# Patient Record
Sex: Female | Born: 1970 | Race: Black or African American | Hispanic: No | Smoking: Never smoker
Health system: Southern US, Community
[De-identification: ages and names within clinical notes are randomized; demographics above are authoritative.]

## PROBLEM LIST (undated history)

## (undated) DIAGNOSIS — E282 Polycystic ovarian syndrome: Secondary | ICD-10-CM

## (undated) DIAGNOSIS — C50919 Malignant neoplasm of unspecified site of unspecified female breast: Secondary | ICD-10-CM

## (undated) DIAGNOSIS — R519 Headache, unspecified: Secondary | ICD-10-CM

## (undated) DIAGNOSIS — I10 Essential (primary) hypertension: Secondary | ICD-10-CM

## (undated) DIAGNOSIS — F329 Major depressive disorder, single episode, unspecified: Secondary | ICD-10-CM

## (undated) DIAGNOSIS — D649 Anemia, unspecified: Secondary | ICD-10-CM

## (undated) DIAGNOSIS — F32A Depression, unspecified: Secondary | ICD-10-CM

## (undated) DIAGNOSIS — R569 Unspecified convulsions: Secondary | ICD-10-CM

## (undated) DIAGNOSIS — R51 Headache: Secondary | ICD-10-CM

## (undated) HISTORY — DX: Malignant neoplasm of unspecified site of unspecified female breast: C50.919

## (undated) HISTORY — DX: Polycystic ovarian syndrome: E28.2

## (undated) HISTORY — PX: KNEE SURGERY: SHX244

---

## 1995-04-30 HISTORY — PX: CHOLECYSTECTOMY: SHX55

## 1997-09-05 ENCOUNTER — Other Ambulatory Visit: Admission: RE | Admit: 1997-09-05 | Discharge: 1997-09-05 | Payer: Self-pay | Admitting: Obstetrics

## 1998-10-03 ENCOUNTER — Other Ambulatory Visit: Admission: RE | Admit: 1998-10-03 | Discharge: 1998-10-03 | Payer: Self-pay | Admitting: Obstetrics

## 1999-04-05 ENCOUNTER — Ambulatory Visit (HOSPITAL_COMMUNITY): Admission: RE | Admit: 1999-04-05 | Discharge: 1999-04-05 | Payer: Self-pay | Admitting: Family Medicine

## 1999-04-05 ENCOUNTER — Encounter: Payer: Self-pay | Admitting: Family Medicine

## 1999-06-24 ENCOUNTER — Encounter: Payer: Self-pay | Admitting: Emergency Medicine

## 1999-06-24 ENCOUNTER — Encounter: Payer: Self-pay | Admitting: Orthopaedic Surgery

## 1999-06-24 ENCOUNTER — Inpatient Hospital Stay (HOSPITAL_COMMUNITY): Admission: EM | Admit: 1999-06-24 | Discharge: 1999-06-26 | Payer: Self-pay | Admitting: Emergency Medicine

## 1999-07-17 ENCOUNTER — Encounter: Payer: Self-pay | Admitting: Orthopedic Surgery

## 1999-07-17 ENCOUNTER — Encounter: Admission: RE | Admit: 1999-07-17 | Discharge: 1999-07-17 | Payer: Self-pay | Admitting: Orthopedic Surgery

## 1999-10-18 ENCOUNTER — Other Ambulatory Visit: Admission: RE | Admit: 1999-10-18 | Discharge: 1999-10-18 | Payer: Self-pay | Admitting: Family Medicine

## 1999-10-26 ENCOUNTER — Encounter: Payer: Self-pay | Admitting: Internal Medicine

## 1999-10-26 ENCOUNTER — Ambulatory Visit (HOSPITAL_COMMUNITY): Admission: RE | Admit: 1999-10-26 | Discharge: 1999-10-26 | Payer: Self-pay | Admitting: Internal Medicine

## 2001-01-02 ENCOUNTER — Other Ambulatory Visit: Admission: RE | Admit: 2001-01-02 | Discharge: 2001-01-02 | Payer: Self-pay | Admitting: Family Medicine

## 2002-05-19 ENCOUNTER — Other Ambulatory Visit: Admission: RE | Admit: 2002-05-19 | Discharge: 2002-05-19 | Payer: Self-pay | Admitting: Obstetrics & Gynecology

## 2003-05-27 ENCOUNTER — Other Ambulatory Visit: Admission: RE | Admit: 2003-05-27 | Discharge: 2003-05-27 | Payer: Self-pay | Admitting: Obstetrics & Gynecology

## 2012-03-13 ENCOUNTER — Ambulatory Visit: Payer: Self-pay | Admitting: Orthopedic Surgery

## 2012-03-17 ENCOUNTER — Ambulatory Visit: Payer: Self-pay | Admitting: Orthopedic Surgery

## 2012-08-15 ENCOUNTER — Emergency Department: Payer: Self-pay | Admitting: Emergency Medicine

## 2012-08-15 LAB — COMPREHENSIVE METABOLIC PANEL
Albumin: 3.5 g/dL (ref 3.4–5.0)
Alkaline Phosphatase: 66 U/L (ref 50–136)
Anion Gap: 7 (ref 7–16)
BUN: 16 mg/dL (ref 7–18)
Bilirubin,Total: 0.4 mg/dL (ref 0.2–1.0)
Calcium, Total: 9 mg/dL (ref 8.5–10.1)
Chloride: 104 mmol/L (ref 98–107)
Co2: 26 mmol/L (ref 21–32)
Creatinine: 0.89 mg/dL (ref 0.60–1.30)
EGFR (African American): >60
EGFR (Non-African Amer.): >60
Glucose: 92 mg/dL (ref 65–99)
Osmolality: 275 (ref 275–301)
Potassium: 3.8 mmol/L (ref 3.5–5.1)
SGOT(AST): 25 U/L (ref 15–37)
SGPT (ALT): 34 U/L (ref 12–78)
Sodium: 137 mmol/L (ref 136–145)
Total Protein: 7.5 g/dL (ref 6.4–8.2)

## 2012-08-15 LAB — CBC
HCT: 43.6 % (ref 35.0–47.0)
HGB: 14.6 g/dL (ref 12.0–16.0)
MCH: 27.4 pg (ref 26.0–34.0)
MCHC: 33.6 g/dL (ref 32.0–36.0)
MCV: 82 fL (ref 80–100)
Platelet: 295 10*3/uL (ref 150–440)
RBC: 5.33 10*6/uL — ABNORMAL HIGH (ref 3.80–5.20)
RDW: 12.7 % (ref 11.5–14.5)
WBC: 9 10*3/uL (ref 3.6–11.0)

## 2012-08-15 LAB — URINALYSIS, COMPLETE
Bacteria: NONE SEEN
Bilirubin,UR: NEGATIVE
Blood: NEGATIVE
Glucose,UR: NEGATIVE mg/dL (ref 0–75)
Ketone: NEGATIVE
Leukocyte Esterase: NEGATIVE
Nitrite: NEGATIVE
Ph: 7 (ref 4.5–8.0)
Protein: NEGATIVE
RBC,UR: NONE SEEN /HPF (ref 0–5)
Specific Gravity: 1.014 (ref 1.003–1.030)
Squamous Epithelial: 1
WBC UR: 1 /HPF (ref 0–5)

## 2012-08-15 LAB — PREGNANCY, URINE: Pregnancy Test, Urine: NEGATIVE m[IU]/mL

## 2012-08-15 LAB — LIPASE, BLOOD: Lipase: 63 U/L — ABNORMAL LOW (ref 73–393)

## 2013-12-23 ENCOUNTER — Ambulatory Visit: Payer: Self-pay | Admitting: Unknown Physician Specialty

## 2014-09-16 LAB — LIPID PANEL
CHOLESTEROL: 155 mg/dL (ref 0–200)
HDL: 36 mg/dL (ref 35–70)
HDL: 56 mg/dL (ref 35–70)
LDL Cholesterol: 96 mg/dL
Triglycerides: 117 mg/dL (ref 40–160)

## 2014-09-16 LAB — HEMOGLOBIN A1C: HEMOGLOBIN A1C: 5.7 % (ref 4.0–6.0)

## 2014-11-29 ENCOUNTER — Encounter: Payer: Self-pay | Admitting: Family Medicine

## 2014-11-29 ENCOUNTER — Ambulatory Visit (INDEPENDENT_AMBULATORY_CARE_PROVIDER_SITE_OTHER): Payer: Managed Care, Other (non HMO) | Admitting: Family Medicine

## 2014-11-29 VITALS — BP 100/64 | HR 72 | Ht 60.0 in | Wt 184.0 lb

## 2014-11-29 DIAGNOSIS — R7309 Other abnormal glucose: Secondary | ICD-10-CM

## 2014-11-29 DIAGNOSIS — G43909 Migraine, unspecified, not intractable, without status migrainosus: Secondary | ICD-10-CM | POA: Diagnosis not present

## 2014-11-29 DIAGNOSIS — E669 Obesity, unspecified: Secondary | ICD-10-CM | POA: Diagnosis not present

## 2014-11-29 DIAGNOSIS — E786 Lipoprotein deficiency: Secondary | ICD-10-CM

## 2014-11-29 DIAGNOSIS — R7303 Prediabetes: Secondary | ICD-10-CM | POA: Insufficient documentation

## 2014-11-29 DIAGNOSIS — Z01818 Encounter for other preprocedural examination: Secondary | ICD-10-CM

## 2014-11-29 DIAGNOSIS — E559 Vitamin D deficiency, unspecified: Secondary | ICD-10-CM | POA: Insufficient documentation

## 2014-11-29 DIAGNOSIS — I10 Essential (primary) hypertension: Secondary | ICD-10-CM

## 2014-11-29 DIAGNOSIS — G43C1 Periodic headache syndromes in child or adult, intractable: Secondary | ICD-10-CM | POA: Insufficient documentation

## 2014-11-29 LAB — VITAMIN D 25 HYDROXY (VIT D DEFICIENCY, FRACTURES): Vit D, 25-Hydroxy: 14.6

## 2014-11-29 MED ORDER — VITAMIN D 50 MCG (2000 UT) PO CAPS: 1.0000 | ORAL_CAPSULE | Freq: Every day | ORAL | Status: DC

## 2014-11-29 NOTE — Progress Notes (Signed)
Date:  11/29/2014   Name:  Mallory Meyers   DOB:  08-14-1970   MRN:  [id number]  PCP:  Mallory Potter, MD    Chief Complaint: Pre-op Exam   History of Present Illness:  This is a 44 y.o. female for pre-op clearance for R foot surgery. Takes Dyazide for BP and verapamil for migraines which are well controlled. Not taking vit D currently but plans to restart. No new complaints. See PE form.  Review of Systems:  Review of Systems  Patient Active Problem List   Diagnosis Date Noted  . Prediabetes 11/29/2014  . Vitamin D deficiency 11/29/2014  . Low HDL (under 40) 11/29/2014  . Obesity, Class II, BMI 35-39.9 11/29/2014  . Hypertension 11/29/2014  . Migraines 11/29/2014    Prior to Admission medications   Medication Sig Start Date End Date Taking? Authorizing Provider  Multiple Vitamins-Minerals (ONE-A-DAY WOMENS 50 PLUS PO) Take 1 tablet by mouth daily at 12 noon.   Yes Historical Provider, MD  triamterene-hydrochlorothiazide (DYAZIDE) 37.5-25 MG per capsule Take 1 capsule by mouth daily.   Yes Historical Provider, MD  verapamil (COVERA HS) 240 MG (CO) 24 hr tablet Take 240 mg by mouth at bedtime.   Yes Historical Provider, MD    No Known Allergies  Past Surgical History  Procedure Laterality Date  . Knee surgery Left   . Cholecystectomy      History  Substance Use Topics  . Smoking status: Never Smoker   . Smokeless tobacco: Not on file  . Alcohol Use: 0.0 oz/week    0 Standard drinks or equivalent per week    No family history on file.  Medication list has been reviewed and updated.  Physical Examination: BP 100/64 mmHg  Pulse 72  Ht 5' (1.524 m)  Wt 184 lb (83.462 kg)  BMI 35.94 kg/m2  LMP 10/03/2014 (Approximate)  Physical Exam  Constitutional: She is oriented to person, place, and time. She appears well-developed and well-nourished.  HENT:  Head: Normocephalic and atraumatic.  Right Ear: External ear normal.  Left Ear: External ear normal.  Mouth/Throat:  Oropharynx is clear and moist.  Eyes: Conjunctivae and EOM are normal. Pupils are equal, round, and reactive to light.  Neck: Neck supple. No thyromegaly present.  Cardiovascular: Normal rate, regular rhythm and normal heart sounds.   Pulmonary/Chest: Effort normal and breath sounds normal.  Abdominal: Soft. She exhibits no distension and no mass. There is no tenderness.  Musculoskeletal: She exhibits no edema.  Lymphadenopathy:    She has no cervical adenopathy.  Neurological: She is alert and oriented to person, place, and time. Coordination normal.  Skin: Skin is warm and dry. No rash noted.  Psychiatric: She has a normal mood and affect. Her behavior is normal.    Assessment and Plan:  1. Prediabetes  2. Vitamin D deficiency Restart supplementation  3. Low HDL (under 40)  4. Essential hypertension Well controlled, consider d/c Dyazide on verapamil next visit  5. Migraine without status migrainosus, not intractable, unspecified migraine type Well controlled on verapamil and prn Zomig  6. Obesity, Class II, BMI 35-39.9  7. Preop general physical exam See form, cleared for foot surgery   Return in about 3 months (around 03/01/2015).  Satira Anis. Burt Ek MD [facility]  11/29/2014

## 2014-12-20 ENCOUNTER — Encounter: Payer: Self-pay | Admitting: *Deleted

## 2014-12-20 ENCOUNTER — Inpatient Hospital Stay: Admission: RE | Admit: 2014-12-20 | Payer: Self-pay | Source: Ambulatory Visit

## 2014-12-20 DIAGNOSIS — R7309 Other abnormal glucose: Secondary | ICD-10-CM | POA: Diagnosis not present

## 2014-12-20 DIAGNOSIS — Z3202 Encounter for pregnancy test, result negative: Secondary | ICD-10-CM | POA: Diagnosis not present

## 2014-12-20 DIAGNOSIS — E669 Obesity, unspecified: Secondary | ICD-10-CM | POA: Diagnosis not present

## 2014-12-20 DIAGNOSIS — E559 Vitamin D deficiency, unspecified: Secondary | ICD-10-CM | POA: Diagnosis not present

## 2014-12-20 DIAGNOSIS — G43909 Migraine, unspecified, not intractable, without status migrainosus: Secondary | ICD-10-CM | POA: Diagnosis not present

## 2014-12-20 DIAGNOSIS — Z79899 Other long term (current) drug therapy: Secondary | ICD-10-CM | POA: Diagnosis not present

## 2014-12-20 DIAGNOSIS — M2041 Other hammer toe(s) (acquired), right foot: Secondary | ICD-10-CM | POA: Diagnosis not present

## 2014-12-20 DIAGNOSIS — I10 Essential (primary) hypertension: Secondary | ICD-10-CM | POA: Diagnosis not present

## 2014-12-20 DIAGNOSIS — Z6835 Body mass index (BMI) 35.0-35.9, adult: Secondary | ICD-10-CM | POA: Diagnosis not present

## 2014-12-20 NOTE — Patient Instructions (Signed)
  Your procedure is scheduled on: 12-23-14 Report to [facility] To find out your arrival time please call [phone] between 1PM - 3PM on 12-22-14 (THURSDAY)  Remember: Instructions that are not followed completely may result in serious medical risk, up to and including death, or upon the discretion of your surgeon and anesthesiologist your surgery may need to be rescheduled.    _X___ 1. Do not eat food or drink liquids after midnight. No gum chewing or hard candies.     _X___ 2. No Alcohol for 24 hours before or after surgery.   ____ 3. Bring all medications with you on the day of surgery if instructed.    _X___ 4. Notify your doctor if there is any change in your medical condition     (cold, fever, infections).     Do not wear jewelry, make-up, hairpins, clips or nail polish.  Do not wear lotions, powders, or perfumes. You may wear deodorant.  Do not shave 48 hours prior to surgery. Men may shave face and neck.  Do not bring valuables to the hospital.    [facility] is not responsible for any belongings or valuables.               Contacts, dentures or bridgework may not be worn into surgery.  Leave your suitcase in the car. After surgery it may be brought to your room.  For patients admitted to the hospital, discharge time is determined by your treatment team.   Patients discharged the day of surgery will not be allowed to drive home.   Please read over the following fact sheets that you were given:      _X___ Take these medicines the morning of surgery with A SIP OF WATER:    1. VERAPAMIL  2.   3.   4.  5.  6.  ____ Fleet Enema (as directed)   _X___ Use CHG Soap as directed  ____ Use inhalers on the day of surgery  ____ Stop metformin 2 days prior to surgery    ____ Take 1/2 of usual insulin dose the night before surgery and none on the morning of surgery.   ____ Stop Coumadin/Plavix/aspirin-N/A  ____ Stop Anti-inflammatories-NO  NSAIDS OR ASPIRIN PRODUCTS-TYLENOL OK   ____ Stop supplements until after surgery.    ____ Bring C-Pap to the hospital.

## 2014-12-22 ENCOUNTER — Encounter
Admission: RE | Admit: 2014-12-22 | Discharge: 2014-12-22 | Disposition: A | Discharge status: Home / Self Care | Payer: Managed Care, Other (non HMO) | Source: Ambulatory Visit | Attending: Anesthesiology | Admitting: Anesthesiology

## 2014-12-22 DIAGNOSIS — M2041 Other hammer toe(s) (acquired), right foot: Secondary | ICD-10-CM | POA: Diagnosis not present

## 2014-12-22 LAB — CBC
HCT: 41.9 % (ref 35.0–47.0)
Hemoglobin: 14 g/dL (ref 12.0–16.0)
MCH: 27.4 pg (ref 26.0–34.0)
MCHC: 33.3 g/dL (ref 32.0–36.0)
MCV: 82.1 fL (ref 80.0–100.0)
PLATELETS: 216 10*3/uL (ref 150–440)
RBC: 5.11 MIL/uL (ref 3.80–5.20)
RDW: 13 % (ref 11.5–14.5)
WBC: 6.8 10*3/uL (ref 3.6–11.0)

## 2014-12-22 LAB — DIFFERENTIAL
BASOS PCT: 1 %
Basophils Absolute: 0.1 10*3/uL (ref 0–0.1)
EOS ABS: 0.2 10*3/uL (ref 0–0.7)
Eosinophils Relative: 2 %
Lymphocytes Relative: 30 %
Lymphs Abs: 2 10*3/uL (ref 1.0–3.6)
MONO ABS: 0.4 10*3/uL (ref 0.2–0.9)
Monocytes Relative: 6 %
NEUTROS ABS: 4.1 10*3/uL (ref 1.4–6.5)
Neutrophils Relative %: 61 %

## 2014-12-22 LAB — POTASSIUM: POTASSIUM: 4.1 mmol/L (ref 3.5–5.1)

## 2014-12-22 NOTE — Pre-Procedure Instructions (Signed)
DR Rosey Bath REVIEWED EKG AND SAID OK TO PROCEED

## 2014-12-23 ENCOUNTER — Ambulatory Visit: Payer: Managed Care, Other (non HMO) | Admitting: Certified Registered Nurse Anesthetist

## 2014-12-23 ENCOUNTER — Encounter
Admission: RE | Disposition: A | Discharge status: Home / Self Care | Payer: Self-pay | Source: Ambulatory Visit | Attending: Podiatry

## 2014-12-23 ENCOUNTER — Ambulatory Visit
Admission: RE | Admit: 2014-12-23 | Discharge: 2014-12-23 | Disposition: A | Discharge status: Home / Self Care | Payer: Managed Care, Other (non HMO) | Source: Ambulatory Visit | Attending: Podiatry | Admitting: Podiatry

## 2014-12-23 DIAGNOSIS — Z79899 Other long term (current) drug therapy: Secondary | ICD-10-CM | POA: Insufficient documentation

## 2014-12-23 DIAGNOSIS — R7309 Other abnormal glucose: Secondary | ICD-10-CM | POA: Insufficient documentation

## 2014-12-23 DIAGNOSIS — E669 Obesity, unspecified: Secondary | ICD-10-CM | POA: Insufficient documentation

## 2014-12-23 DIAGNOSIS — G43909 Migraine, unspecified, not intractable, without status migrainosus: Secondary | ICD-10-CM | POA: Insufficient documentation

## 2014-12-23 DIAGNOSIS — M2041 Other hammer toe(s) (acquired), right foot: Secondary | ICD-10-CM | POA: Insufficient documentation

## 2014-12-23 DIAGNOSIS — Z3202 Encounter for pregnancy test, result negative: Secondary | ICD-10-CM | POA: Insufficient documentation

## 2014-12-23 DIAGNOSIS — Z6835 Body mass index (BMI) 35.0-35.9, adult: Secondary | ICD-10-CM | POA: Insufficient documentation

## 2014-12-23 DIAGNOSIS — E559 Vitamin D deficiency, unspecified: Secondary | ICD-10-CM | POA: Insufficient documentation

## 2014-12-23 DIAGNOSIS — I10 Essential (primary) hypertension: Secondary | ICD-10-CM | POA: Insufficient documentation

## 2014-12-23 HISTORY — DX: Anemia, unspecified: D64.9

## 2014-12-23 HISTORY — DX: Major depressive disorder, single episode, unspecified: F32.9

## 2014-12-23 HISTORY — DX: Headache, unspecified: R51.9

## 2014-12-23 HISTORY — DX: Essential (primary) hypertension: I10

## 2014-12-23 HISTORY — DX: Headache: R51

## 2014-12-23 HISTORY — DX: Depression, unspecified: F32.A

## 2014-12-23 HISTORY — PX: TOE ARTHROPLASTY: SHX6504

## 2014-12-23 HISTORY — DX: Unspecified convulsions: R56.9

## 2014-12-23 LAB — POCT PREGNANCY, URINE: PREG TEST UR: NEGATIVE

## 2014-12-23 SURGERY — ARTHROPLASTY, TOE
Anesthesia: Monitor Anesthesia Care | Laterality: Right

## 2014-12-23 MED ORDER — METOCLOPRAMIDE HCL 5 MG/ML IJ SOLN: 10.0000 mg | Freq: Once | INTRAMUSCULAR | Status: DC | PRN

## 2014-12-23 MED ORDER — HYDROCODONE-ACETAMINOPHEN 5-325 MG PO TABS: 1.0000 | ORAL_TABLET | ORAL | Status: DC | PRN

## 2014-12-23 MED ORDER — LIDOCAINE HCL (CARDIAC) 20 MG/ML IV SOLN
INTRAVENOUS | Status: DC | PRN
  Administered 2014-12-23: 100 mg via INTRAVENOUS

## 2014-12-23 MED ORDER — FAMOTIDINE 20 MG PO TABS
20.0000 mg | ORAL_TABLET | Freq: Once | ORAL | Status: AC
  Administered 2014-12-23: 20 mg via ORAL

## 2014-12-23 MED ORDER — SODIUM CHLORIDE 0.9 % IJ SOLN: 3.0000 mL | Freq: Two times a day (BID) | INTRAMUSCULAR | Status: DC

## 2014-12-23 MED ORDER — PROPOFOL 10 MG/ML IV BOLUS
INTRAVENOUS | Status: DC | PRN
  Administered 2014-12-23 (×2): 30 mg via INTRAVENOUS

## 2014-12-23 MED ORDER — LACTATED RINGERS IV SOLN
INTRAVENOUS | Status: DC
  Administered 2014-12-23 (×2): via INTRAVENOUS

## 2014-12-23 MED ORDER — NEOMYCIN-POLYMYXIN B GU 40-200000 IR SOLN
Status: DC | PRN
  Administered 2014-12-23: 2 mL

## 2014-12-23 MED ORDER — BUPIVACAINE HCL 0.5 % IJ SOLN
INTRAMUSCULAR | Status: DC | PRN
  Administered 2014-12-23: 7 mL

## 2014-12-23 MED ORDER — PROPOFOL INFUSION 10 MG/ML OPTIME
INTRAVENOUS | Status: DC | PRN
  Administered 2014-12-23: 100 ug/kg/min via INTRAVENOUS

## 2014-12-23 MED ORDER — BUPIVACAINE HCL (PF) 0.5 % IJ SOLN
INTRAMUSCULAR | Status: AC
  Filled 2014-12-23: qty 30

## 2014-12-23 MED ORDER — ONDANSETRON HCL 4 MG/2ML IJ SOLN
INTRAMUSCULAR | Status: DC | PRN
  Administered 2014-12-23: 4 mg via INTRAVENOUS

## 2014-12-23 MED ORDER — NEOMYCIN-POLYMYXIN B GU 40-200000 IR SOLN
Status: AC
  Filled 2014-12-23: qty 2

## 2014-12-23 MED ORDER — MIDAZOLAM HCL 2 MG/2ML IJ SOLN
INTRAMUSCULAR | Status: DC | PRN
Start: 2014-12-23 — End: 2014-12-23
  Administered 2014-12-23: 2 mg via INTRAVENOUS

## 2014-12-23 MED ORDER — SODIUM CHLORIDE 0.9 % IV SOLN: 250.0000 mL | INTRAVENOUS | Status: DC | PRN

## 2014-12-23 MED ORDER — SODIUM CHLORIDE 0.9 % IJ SOLN: 3.0000 mL | INTRAMUSCULAR | Status: DC | PRN

## 2014-12-23 MED ORDER — HYDROMORPHONE HCL 1 MG/ML IJ SOLN: 0.2500 mg | INTRAMUSCULAR | Status: DC | PRN

## 2014-12-23 SURGICAL SUPPLY — 40 items
BLADE OSC/SAGITTAL MD 5.5X18 (BLADE) ×3 IMPLANT
BLADE SURG 15 STRL LF DISP TIS (BLADE) ×2 IMPLANT
BLADE SURG 15 STRL SS (BLADE) ×6
BLADE SURG MINI STRL (BLADE) ×3 IMPLANT
BNDG CMPR 75X21 PLY HI ABS (MISCELLANEOUS) ×2
BNDG ESMARK 4X12 TAN STRL LF (GAUZE/BANDAGES/DRESSINGS) ×3 IMPLANT
CANISTER SUCT 1200ML W/VALVE (MISCELLANEOUS) ×3 IMPLANT
CLOSURE WOUND 1/4X4 (GAUZE/BANDAGES/DRESSINGS) ×1
CUFF TOURN 18 STER (MISCELLANEOUS) ×1 IMPLANT
CUFF TOURN DUAL PL 12 NO SLV (MISCELLANEOUS) ×3 IMPLANT
DRAPE FLUOR MINI C-ARM 54X84 (DRAPES) ×3 IMPLANT
DURAPREP 26ML APPLICATOR (WOUND CARE) ×3 IMPLANT
GAUZE PETRO XEROFOAM 1X8 (MISCELLANEOUS) ×3 IMPLANT
GAUZE SPONGE 4X4 12PLY STRL (GAUZE/BANDAGES/DRESSINGS) ×3 IMPLANT
GAUZE STRETCH 2X75IN STRL (MISCELLANEOUS) ×5 IMPLANT
GLOVE BIO SURGEON STRL SZ7.5 (GLOVE) ×5 IMPLANT
GLOVE INDICATOR 8.0 STRL GRN (GLOVE) ×5 IMPLANT
GOWN STRL REUS W/ TWL LRG LVL3 (GOWN DISPOSABLE) ×2 IMPLANT
GOWN STRL REUS W/TWL LRG LVL3 (GOWN DISPOSABLE) ×6
LABEL OR SOLS (LABEL) ×1 IMPLANT
NDL FILTER BLUNT 18X1 1/2 (NEEDLE) ×1 IMPLANT
NDL HYPO 25X1 1.5 SAFETY (NEEDLE) ×2 IMPLANT
NEEDLE FILTER BLUNT 18X 1/2SAF (NEEDLE) ×2
NEEDLE FILTER BLUNT 18X1 1/2 (NEEDLE) ×1 IMPLANT
NEEDLE HYPO 25X1 1.5 SAFETY (NEEDLE) ×6 IMPLANT
NS IRRIG 500ML POUR BTL (IV SOLUTION) ×3 IMPLANT
PACK EXTREMITY ARMC (MISCELLANEOUS) ×3 IMPLANT
PAD GROUND ADULT SPLIT (MISCELLANEOUS) ×3 IMPLANT
RASP SM TEAR CROSS CUT (RASP) ×2 IMPLANT
SOL PREP PVP 2OZ (MISCELLANEOUS)
SOLUTION PREP PVP 2OZ (MISCELLANEOUS) ×1 IMPLANT
STOCKINETTE STRL 6IN 960660 (GAUZE/BANDAGES/DRESSINGS) ×3 IMPLANT
STRAP SAFETY BODY (MISCELLANEOUS) ×3 IMPLANT
STRIP CLOSURE SKIN 1/4X4 (GAUZE/BANDAGES/DRESSINGS) ×2 IMPLANT
SUCTION FRAZIER TIP 10 FR DISP (SUCTIONS) ×2 IMPLANT
SUT ETHILON 5-0 FS-2 18 BLK (SUTURE) ×3 IMPLANT
SUT VIC AB 4-0 FS2 27 (SUTURE) ×3 IMPLANT
SYRINGE 10CC LL (SYRINGE) ×3 IMPLANT
WIRE Z .045 C-WIRE SPADE TIP (WIRE) ×2 IMPLANT
WIRE Z .062 C-WIRE SPADE TIP (WIRE) ×2 IMPLANT

## 2014-12-23 NOTE — Discharge Instructions (Addendum)
1. Elevate right lower extremity area.  2. Keep the bandage on the right foot clean, dry, and do not remove.  3. Sponge bathe only right lower extremity.  4. Wear surgical shoe on the right foot whenever walking or standing.  5. Take one pain pill, Norco, every 4 hours as needed for pain.

## 2014-12-23 NOTE — Op Note (Signed)
Date of operation: 12/23/2014.  Surgeon: Durward Fortes DPM.  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  Postoperative diagnosis: Same.  Procedures: Arthroplasty right fourth and fifth toes.  Anesthesia: Local Mac.  Hemostasis: Pneumatic tourniquet right ankle.  Estimated blood loss: Minimal.  Pathology: None.  Materials: None.  Complications: None apparent.  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 Conservative care has proved unsuccessful and she elects for surgical intervention.  Operative procedure: The patient was taken to the operating room and placed on the table in the supine position. Following satisfactory sedation the right foot was anesthetized with 7 cc of 0.5% bupivacaine plain around the base of the right fourth and fifth toes. A pneumatic tourniquet was applied at the level of the right ankle and the foot was prepped and draped in the usual sterile fashion. The foot was exsanguinated and the tourniquet inflated to 250 mmHg.  Attention was then directed to the dorsal aspect of the right foot where an approximate 1.5 cm linear incisions were made over the proximal interphalangeal joint of both the fourth and fifth toes. The incision was deepened down to the level of the joint where a transverse tenotomy was performed and the capsular and periosteal tissues reflected off of the head of the proximal phalanx of both of the fourth and fifth toes. Using a pneumatic saw the head of the proximal phalanx was resected in toto on both the fourth and fifth toes. Some medial contracture was still noted in the fifth toe and a partial release of the medial aspect of the flexor tendon was performed which did relieve some of the contracture on the fifth toe. Next using a Beaver blade the periosteal tissues were reflected off of the medial and lateral aspect of  the middle phalanx on the fifth toe and using a pneumatic saw the medial and lateral prominences of the middle phalanx were resected and removed in toto. The edges were rasped smooth. There was noted be good reduction of the bony prominence clinically. Intraoperative FluoroScan views revealed good reduction of the deformity with alignment of the toes. The wounds were then flushed with copious amounts of sterile saline and closed using 40 vital simple interrupted suture for tendon reapproximation followed by skin closure using 5-0 nylon simple interrupted sutures. Xeroform and sterile bandages applied. The tourniquet was released and blood flow noted to return immediately to the right foot and all digits. Ace bandage was applied for compression. Patient tolerated the procedure and anesthesia well and was transported to the PACU with vital signs stable and in good condition.

## 2014-12-23 NOTE — Interval H&P Note (Signed)
History and Physical Interval Note:  12/23/2014 9:17 AM  Mallory Meyers  has presented today for surgery, with the diagnosis of HAMMERTOE RT.4TH AND 5TH TOES  The various methods of treatment have been discussed with the patient and family. After consideration of risks, benefits and other options for treatment, the patient has consented to  Procedure(s): TOE ARTHROPLASTY (Right) as a surgical intervention .  The patient's history has been reviewed, patient examined, no change in status, stable for surgery.  I have reviewed the patient's chart and labs.  Questions were answered to the patient's satisfaction.     [name] W.

## 2014-12-23 NOTE — Transfer of Care (Signed)
Immediate Anesthesia Transfer of Care Note  Patient: Mallory Meyers  Procedure(s) Performed: Procedure(s): TOE ARTHROPLASTY (Right)  Patient Location: PACU  Anesthesia Type:MAC  Level of Consciousness: awake  Airway & Oxygen Therapy: Patient Spontanous Breathing  Post-op Assessment: Report given to RN  Post vital signs: stable  Last Vitals:  Filed Vitals:   12/23/14 0857  BP: 122/76  Pulse: 90  Temp: 36.8 C  Resp: 16    Complications: No apparent anesthesia complications

## 2014-12-23 NOTE — Anesthesia Preprocedure Evaluation (Signed)
Anesthesia Evaluation  Patient identified by MRN, date of birth, ID band Patient awake    Reviewed: Allergy & Precautions, NPO status , Patient's Chart, lab work & pertinent test results  Airway Mallampati: II  TM Distance: >3 FB Neck ROM: Full    Dental  (+) Teeth Intact Braces.:   Pulmonary    Pulmonary exam normal       Cardiovascular Exercise Tolerance: Good hypertension, Pt. on medications Normal cardiovascular exam    Neuro/Psych    GI/Hepatic negative GI ROS,   Endo/Other  negative endocrine ROS  Renal/GU      Musculoskeletal   Abdominal (+) + obese,   Peds  Hematology negative hematology ROS (+) Hb is 14.   Anesthesia Other Findings   Reproductive/Obstetrics                             Anesthesia Physical Anesthesia Plan  ASA: II  Anesthesia Plan: MAC   Post-op Pain Management:    Induction: Intravenous  Airway Management Planned: Simple Face Mask  Additional Equipment:   Intra-op Plan:   Post-operative Plan:   Informed Consent: I have reviewed the patients History and Physical, chart, labs and discussed the procedure including the risks, benefits and alternatives for the proposed anesthesia with the patient or authorized representative who has indicated his/her understanding and acceptance.     Plan Discussed with:   Anesthesia Plan Comments:         Anesthesia Quick Evaluation

## 2014-12-23 NOTE — H&P (View-Only) (Signed)
Date:  11/29/2014   Name:  Mallory Meyers   DOB:  [birth year]   MRN:  [id number]  PCP:  Adline Potter, MD    Chief Complaint: Pre-op Exam   History of Present Illness:  This is a 44 y.o. female for pre-op clearance for R foot surgery. Takes Dyazide for BP and verapamil for migraines which are well controlled. Not taking vit D currently but plans to restart. No new complaints. See PE form.  Review of Systems:  Review of Systems  Patient Active Problem List   Diagnosis Date Noted  . Prediabetes 11/29/2014  . Vitamin D deficiency 11/29/2014  . Low HDL (under 40) 11/29/2014  . Obesity, Class II, BMI 35-39.9 11/29/2014  . Hypertension 11/29/2014  . Migraines 11/29/2014    Prior to Admission medications   Medication Sig Start Date End Date Taking? Authorizing Provider  Multiple Vitamins-Minerals (ONE-A-DAY WOMENS 50 PLUS PO) Take 1 tablet by mouth daily at 12 noon.   Yes Historical Provider, MD  triamterene-hydrochlorothiazide (DYAZIDE) 37.5-25 MG per capsule Take 1 capsule by mouth daily.   Yes Historical Provider, MD  verapamil (COVERA HS) 240 MG (CO) 24 hr tablet Take 240 mg by mouth at bedtime.   Yes Historical Provider, MD    No Known Allergies  Past Surgical History  Procedure Laterality Date  . Knee surgery Left   . Cholecystectomy      History  Substance Use Topics  . Smoking status: Never Smoker   . Smokeless tobacco: Not on file  . Alcohol Use: 0.0 oz/week    0 Standard drinks or equivalent per week    No family history on file.  Medication list has been reviewed and updated.  Physical Examination: BP 100/64 mmHg  Pulse 72  Ht 5' (1.524 m)  Wt 184 lb (83.462 kg)  BMI 35.94 kg/m2  LMP 10/03/2014 (Approximate)  Physical Exam  Constitutional: She is oriented to person, place, and time. She appears well-developed and well-nourished.  HENT:  Head: Normocephalic and atraumatic.  Right Ear: External ear normal.  Left Ear: External ear normal.  Mouth/Throat:  Oropharynx is clear and moist.  Eyes: Conjunctivae and EOM are normal. Pupils are equal, round, and reactive to light.  Neck: Neck supple. No thyromegaly present.  Cardiovascular: Normal rate, regular rhythm and normal heart sounds.   Pulmonary/Chest: Effort normal and breath sounds normal.  Abdominal: Soft. She exhibits no distension and no mass. There is no tenderness.  Musculoskeletal: She exhibits no edema.  Lymphadenopathy:    She has no cervical adenopathy.  Neurological: She is alert and oriented to person, place, and time. Coordination normal.  Skin: Skin is warm and dry. No rash noted.  Psychiatric: She has a normal mood and affect. Her behavior is normal.    Assessment and Plan:  1. Prediabetes  2. Vitamin D deficiency Restart supplementation  3. Low HDL (under 40)  4. Essential hypertension Well controlled, consider d/c Dyazide on verapamil next visit  5. Migraine without status migrainosus, not intractable, unspecified migraine type Well controlled on verapamil and prn Zomig  6. Obesity, Class II, BMI 35-39.9  7. Preop general physical exam See form, cleared for foot surgery   Return in about 3 months (around 03/01/2015).  Satira Anis. Burt Ek MD [facility]  11/29/2014

## 2014-12-23 NOTE — Anesthesia Postprocedure Evaluation (Signed)
  Anesthesia Post-op Note  Patient: Mallory Meyers  Procedure(s) Performed: Procedure(s): TOE ARTHROPLASTY (Right)  Anesthesia type:MAC  Patient location: PACU  Post pain: Pain level controlled  Post assessment: Post-op Vital signs reviewed, Patient's Cardiovascular Status Stable, Respiratory Function Stable, Patent Airway and No signs of Nausea or vomiting  Post vital signs: Reviewed and stable  Last Vitals:  Filed Vitals:   12/23/14 0857  BP: 122/76  Pulse: 90  Temp: 36.8 C  Resp: 16    Level of consciousness: awake, alert  and patient cooperative  Complications: No apparent anesthesia complications

## 2015-06-16 ENCOUNTER — Ambulatory Visit (INDEPENDENT_AMBULATORY_CARE_PROVIDER_SITE_OTHER): Payer: Managed Care, Other (non HMO) | Admitting: Family Medicine

## 2015-06-16 ENCOUNTER — Ambulatory Visit: Payer: Managed Care, Other (non HMO) | Admitting: Family Medicine

## 2015-06-16 ENCOUNTER — Encounter: Payer: Self-pay | Admitting: Family Medicine

## 2015-06-16 VITALS — BP 138/86 | HR 72 | Resp 16 | Ht 60.0 in | Wt 193.6 lb

## 2015-06-16 DIAGNOSIS — R5383 Other fatigue: Secondary | ICD-10-CM | POA: Diagnosis not present

## 2015-06-16 DIAGNOSIS — R7303 Prediabetes: Secondary | ICD-10-CM | POA: Diagnosis not present

## 2015-06-16 DIAGNOSIS — I10 Essential (primary) hypertension: Secondary | ICD-10-CM | POA: Diagnosis not present

## 2015-06-16 DIAGNOSIS — E669 Obesity, unspecified: Secondary | ICD-10-CM

## 2015-06-16 DIAGNOSIS — E559 Vitamin D deficiency, unspecified: Secondary | ICD-10-CM

## 2015-06-16 DIAGNOSIS — E66812 Obesity, class 2: Secondary | ICD-10-CM

## 2015-06-16 DIAGNOSIS — R1013 Epigastric pain: Secondary | ICD-10-CM | POA: Diagnosis not present

## 2015-06-16 DIAGNOSIS — Z23 Encounter for immunization: Secondary | ICD-10-CM

## 2015-06-16 DIAGNOSIS — G43919 Migraine, unspecified, intractable, without status migrainosus: Secondary | ICD-10-CM | POA: Diagnosis not present

## 2015-06-16 MED ORDER — VERAPAMIL HCL ER 360 MG PO CP24: 360.0000 mg | ORAL_CAPSULE | Freq: Every day | ORAL | Status: DC

## 2015-06-16 MED ORDER — ZOLMITRIPTAN 5 MG PO TABS: 5.0000 mg | ORAL_TABLET | Freq: Once | ORAL | Status: DC | PRN

## 2015-06-16 NOTE — Patient Instructions (Signed)
Increase verapamil from 240 mg daily to 360 mg daily. Increase Zomig from 2.5 mg as needed to 5 mg as needed.

## 2015-06-16 NOTE — Progress Notes (Signed)
Date:  06/16/2015   Name:  Mallory Meyers   DOB:  10/20/1970   MRN:  [id number]  PCP:  Adline Potter, MD    Chief Complaint: Gastroesophageal Reflux   History of Present Illness:  This is a 45 y.o. female requesting rx for omeprazole given frequent dyspepsia. Dx'd with H. Pylori in 2014 and rx'd with PPI and antibiotics. Migraines have been poorly controlled and has been taking frequent doses of BC powders and ibuprofen as Tylenol ineffective and insurance limits Zomig quantities. Zomig helps when she takes it. Verapamil last increased about a year ago. No longer taking Maxide for HTN and has not been taking vit D supplement regularly. Weight up 9# past 6 months. Also c/o increased fatigue over past several months. Hx prediabetes.  Review of Systems:  Review of Systems  Constitutional: Negative for fever.  Respiratory: Negative for cough and shortness of breath.   Cardiovascular: Negative for chest pain and leg swelling.  Endocrine: Negative for polyuria.  Genitourinary: Negative for difficulty urinating.  Neurological: Negative for syncope and light-headedness.    Patient Active Problem List   Diagnosis Date Noted  . Prediabetes 11/29/2014  . Vitamin D deficiency 11/29/2014  . Low HDL (under 40) 11/29/2014  . Obesity, Class II, BMI 35-39.9 11/29/2014  . Hypertension 11/29/2014  . Migraines 11/29/2014    Prior to Admission medications   Medication Sig Start Date End Date Taking? Authorizing Provider  Cholecalciferol (VITAMIN D) 2000 UNITS CAPS Take 1 capsule (2,000 Units total) by mouth daily. 11/29/14  Yes Adline Potter, MD  Multiple Vitamins-Minerals (ONE-A-DAY WOMENS 50 PLUS PO) Take 1 tablet by mouth daily at 12 noon.   Yes Historical Provider, MD  ZOLMitriptan (ZOMIG) 5 MG tablet Take 1 tablet (5 mg total) by mouth once as needed. May repeat in two hours as needed 06/16/15  Yes Adline Potter, MD  verapamil (VERELAN PM) 360 MG 24 hr capsule Take 1 capsule (360 mg total) by mouth  at bedtime. 06/16/15   Adline Potter, MD    No Known Allergies  Past Surgical History  Procedure Laterality Date  . Knee surgery Left   . Cholecystectomy    . Toe arthroplasty Right 12/23/2014    Procedure: TOE ARTHROPLASTY;  Surgeon: Sharlotte Alamo, MD;  Location: [facility] ORS;  Service: Podiatry;  Laterality: Right;    Social History  Substance Use Topics  . Smoking status: Never Smoker   . Smokeless tobacco: None  . Alcohol Use: 0.0 oz/week    0 Standard drinks or equivalent per week     Comment: [place]    History reviewed. No pertinent family history.  Medication list has been reviewed and updated.  Physical Examination: BP 138/86 mmHg  Pulse 72  Resp 16  Ht 5' (1.524 m)  Wt 193 lb 9.6 oz (87.816 kg)  BMI 37.81 kg/m2  LMP 06/02/2015 (Approximate)  Physical Exam  Constitutional: She appears well-developed and well-nourished.  Cardiovascular: Normal rate, regular rhythm and normal heart sounds.   Pulmonary/Chest: Effort normal and breath sounds normal.  Musculoskeletal: She exhibits no edema.  Neurological: She is alert.  Skin: Skin is warm and dry.  Psychiatric: She has a normal mood and affect. Her behavior is normal.  Nursing note and vitals reviewed.   Assessment and Plan:  1. Other fatigue Unclear etiology, check labs - TSH - Comprehensive metabolic panel - CBC  2. Intractable migraine without status migrainosus, unspecified migraine type Poor control, increase verapamil ER to 360 mg daily and Zomig  to 5 mg prn  3. Dyspepsia Likely due to frequent ibuprofen/BC powder use, discussed desire to avoid long-term PPI use - H. pylori antibody, IgG  4. Prediabetes On NCS diet - HgB A1c  5. Vitamin D deficiency On supplement but not taking regularly - Vitamin D (25 hydroxy)  6. Obesity, Class II, BMI 35-39.9 Discussed exercise/weight loss  7. HTN Marginal control, should improve on increased verapamil  8. Need for influenza vaccination - Flu Vaccine QUAD  36+ mos PF IM (Fluarix & Fluzone Quad PF)  Return in about 4 weeks (around 07/14/2015).  Satira Anis. [facility]  06/16/2015

## 2015-06-20 LAB — COMPREHENSIVE METABOLIC PANEL
A/G RATIO: 1.3 (ref 1.1–2.5)
ALBUMIN: 3.9 g/dL (ref 3.5–5.5)
ALT: 13 IU/L (ref 0–32)
AST: 13 IU/L (ref 0–40)
Alkaline Phosphatase: 43 IU/L (ref 39–117)
BILIRUBIN TOTAL: 0.3 mg/dL (ref 0.0–1.2)
BUN / CREAT RATIO: 16 (ref 9–23)
BUN: 15 mg/dL (ref 6–24)
CALCIUM: 9.1 mg/dL (ref 8.7–10.2)
CHLORIDE: 100 mmol/L (ref 96–106)
CO2: 23 mmol/L (ref 18–29)
Creatinine, Ser: 0.94 mg/dL (ref 0.57–1.00)
GFR, EST AFRICAN AMERICAN: 85 mL/min/{1.73_m2} (ref >59)
GFR, EST NON AFRICAN AMERICAN: 74 mL/min/{1.73_m2} (ref >59)
GLOBULIN, TOTAL: 3.1 g/dL (ref 1.5–4.5)
Glucose: 87 mg/dL (ref 65–99)
POTASSIUM: 4.7 mmol/L (ref 3.5–5.2)
SODIUM: 136 mmol/L (ref 134–144)
TOTAL PROTEIN: 7 g/dL (ref 6.0–8.5)

## 2015-06-20 LAB — CBC
HEMATOCRIT: 40.4 % (ref 34.0–46.6)
Hemoglobin: 13.6 g/dL (ref 11.1–15.9)
MCH: 27.5 pg (ref 26.6–33.0)
MCHC: 33.7 g/dL (ref 31.5–35.7)
MCV: 82 fL (ref 79–97)
Platelets: 302 10*3/uL (ref 150–379)
RBC: 4.94 x10E6/uL (ref 3.77–5.28)
RDW: 13 % (ref 12.3–15.4)
WBC: 6.6 10*3/uL (ref 3.4–10.8)

## 2015-06-20 LAB — VITAMIN D 25 HYDROXY (VIT D DEFICIENCY, FRACTURES): Vit D, 25-Hydroxy: 10.1 ng/mL — ABNORMAL LOW (ref 30.0–100.0)

## 2015-06-20 LAB — HEMOGLOBIN A1C
Est. average glucose Bld gHb Est-mCnc: 123 mg/dL
HEMOGLOBIN A1C: 5.9 % — AB (ref 4.8–5.6)

## 2015-06-20 LAB — H. PYLORI ANTIBODY, IGG: H Pylori IgG: 1.3 U/mL — ABNORMAL HIGH (ref 0.0–0.8)

## 2015-06-20 LAB — TSH: TSH: 1.45 u[IU]/mL (ref 0.450–4.500)

## 2015-07-17 ENCOUNTER — Ambulatory Visit: Payer: Managed Care, Other (non HMO) | Admitting: Family Medicine

## 2015-07-17 ENCOUNTER — Ambulatory Visit (INDEPENDENT_AMBULATORY_CARE_PROVIDER_SITE_OTHER): Payer: Managed Care, Other (non HMO) | Admitting: Family Medicine

## 2015-07-17 ENCOUNTER — Encounter: Payer: Self-pay | Admitting: Family Medicine

## 2015-07-17 VITALS — BP 142/90 | HR 81 | Resp 16 | Ht 60.0 in | Wt 189.2 lb

## 2015-07-17 DIAGNOSIS — M545 Low back pain, unspecified: Secondary | ICD-10-CM

## 2015-07-17 DIAGNOSIS — G43919 Migraine, unspecified, intractable, without status migrainosus: Secondary | ICD-10-CM | POA: Diagnosis not present

## 2015-07-17 DIAGNOSIS — I10 Essential (primary) hypertension: Secondary | ICD-10-CM | POA: Diagnosis not present

## 2015-07-17 DIAGNOSIS — E669 Obesity, unspecified: Secondary | ICD-10-CM | POA: Diagnosis not present

## 2015-07-17 DIAGNOSIS — E559 Vitamin D deficiency, unspecified: Secondary | ICD-10-CM

## 2015-07-17 DIAGNOSIS — R7303 Prediabetes: Secondary | ICD-10-CM

## 2015-07-17 DIAGNOSIS — E66812 Obesity, class 2: Secondary | ICD-10-CM

## 2015-07-17 MED ORDER — METOPROLOL SUCCINATE ER 50 MG PO TB24: 50.0000 mg | ORAL_TABLET | Freq: Every day | ORAL | Status: DC

## 2015-07-17 MED ORDER — CYCLOBENZAPRINE HCL 10 MG PO TABS: 10.0000 mg | ORAL_TABLET | Freq: Every evening | ORAL | Status: DC | PRN

## 2015-07-18 NOTE — Progress Notes (Signed)
Date:  07/17/2015   Name:  Mallory Meyers   DOB:  [birth year]   MRN:  [id number]  PCP:  Adline Potter, MD    Chief Complaint: Hypertension; Headache; and Back Pain   History of Present Illness:  This is a 45 y.o. female for one month f/u. Intolerant of increased verapamil dose, had to return to previous dose and still having frequent migraines. On Topamax in past but unsure of dose. Increased dose of Zomig is more effective. Fatigue and dyspepsia somewhat improved, blood work showed stable prediabetes and low vit D, on increased supplement. Weight down 4#. C/o R lower back pain without radiation, keeping up at night, can't afford chiro. Review of Systems:  Review of Systems  Constitutional: Negative for fever.  Respiratory: Negative for cough and shortness of breath.   Cardiovascular: Negative for chest pain and leg swelling.  Endocrine: Negative for polyuria.  Genitourinary: Negative for difficulty urinating.  Neurological: Negative for syncope and light-headedness.    Patient Active Problem List   Diagnosis Date Noted  . Prediabetes 11/29/2014  . Vitamin D deficiency 11/29/2014  . Low HDL (under 40) 11/29/2014  . Obesity, Class II, BMI 35-39.9 11/29/2014  . Hypertension 11/29/2014  . Migraines 11/29/2014    Prior to Admission medications   Medication Sig Start Date End Date Taking? Authorizing Provider  Cholecalciferol (VITAMIN D) 2000 UNITS CAPS Take 1 capsule (2,000 Units total) by mouth daily. 11/29/14  Yes Adline Potter, MD  Multiple Vitamins-Minerals (ONE-A-DAY WOMENS 50 PLUS PO) Take 1 tablet by mouth daily at 12 noon.   Yes Historical Provider, MD  ZOLMitriptan (ZOMIG) 5 MG tablet Take 1 tablet (5 mg total) by mouth once as needed. May repeat in two hours as needed 06/16/15  Yes Adline Potter, MD  cyclobenzaprine (FLEXERIL) 10 MG tablet Take 1 tablet (10 mg total) by mouth at bedtime as needed for muscle spasms. 07/17/15   Adline Potter, MD  metoprolol succinate (TOPROL-XL) 50  MG 24 hr tablet Take 1 tablet (50 mg total) by mouth daily. Take with or immediately following a meal. 07/17/15   Adline Potter, MD    No Known Allergies  Past Surgical History  Procedure Laterality Date  . Knee surgery Left   . Cholecystectomy    . Toe arthroplasty Right 12/23/2014    Procedure: TOE ARTHROPLASTY;  Surgeon: Sharlotte Alamo, MD;  Location: [facility] ORS;  Service: Podiatry;  Laterality: Right;    Social History  Substance Use Topics  . Smoking status: Never Smoker   . Smokeless tobacco: None  . Alcohol Use: 0.0 oz/week    0 Standard drinks or equivalent per week     Comment: [place]    History reviewed. No pertinent family history.  Medication list has been reviewed and updated.  Physical Examination: BP 142/90 mmHg  Pulse 81  Resp 16  Ht 5' (1.524 m)  Wt 189 lb 3.2 oz (85.821 kg)  BMI 36.95 kg/m2  SpO2 99%  Physical Exam  Constitutional: She appears well-developed and well-nourished.  Cardiovascular: Normal rate, regular rhythm and normal heart sounds.   Pulmonary/Chest: Effort normal and breath sounds normal.  Musculoskeletal: She exhibits no edema.  Negative R SLR, mild R paralumbar tenderness  Neurological: She is alert.  Skin: Skin is warm and dry.  Psychiatric: She has a normal mood and affect. Her behavior is normal.  Nursing note and vitals reviewed.   Assessment and Plan:  1. Intractable migraine without status migrainosus, unspecified migraine type Change verapamil to  metoprolol XL 50 mg daily  2. Essential hypertension Marginal control, may improve on metoprolol, could consider using with verapamil  3. Prediabetes Stable, cont NCS diet, recheck a1c next visit  4. Vitamin D deficiency On increased supplementation, recheck level next visit  5. Obesity, Class II, BMI 35-39.9 Improved, cont weight loss  6. Right-sided low back pain without sciatica Trial Flexeril 10 mg qhs  Return in about 4 weeks (around 08/14/2015).  Satira Anis. [facility]  07/18/2015

## 2015-08-14 ENCOUNTER — Ambulatory Visit (INDEPENDENT_AMBULATORY_CARE_PROVIDER_SITE_OTHER): Payer: Managed Care, Other (non HMO) | Admitting: Family Medicine

## 2015-08-14 ENCOUNTER — Encounter: Payer: Self-pay | Admitting: Family Medicine

## 2015-08-14 VITALS — BP 106/79 | HR 84 | Ht 60.0 in | Wt 188.6 lb

## 2015-08-14 DIAGNOSIS — M545 Low back pain, unspecified: Secondary | ICD-10-CM

## 2015-08-14 DIAGNOSIS — R7303 Prediabetes: Secondary | ICD-10-CM

## 2015-08-14 DIAGNOSIS — E669 Obesity, unspecified: Secondary | ICD-10-CM

## 2015-08-14 DIAGNOSIS — E559 Vitamin D deficiency, unspecified: Secondary | ICD-10-CM

## 2015-08-14 DIAGNOSIS — G43919 Migraine, unspecified, intractable, without status migrainosus: Secondary | ICD-10-CM

## 2015-08-14 DIAGNOSIS — I10 Essential (primary) hypertension: Secondary | ICD-10-CM | POA: Diagnosis not present

## 2015-08-14 MED ORDER — NAPROXEN 500 MG PO TABS: 500.0000 mg | ORAL_TABLET | Freq: Two times a day (BID) | ORAL | Status: DC

## 2015-08-14 MED ORDER — METOPROLOL SUCCINATE ER 100 MG PO TB24: 100.0000 mg | ORAL_TABLET | Freq: Every day | ORAL | Status: DC

## 2015-08-15 ENCOUNTER — Telehealth: Payer: Self-pay

## 2015-08-15 LAB — HEMOGLOBIN A1C
ESTIMATED AVERAGE GLUCOSE: 117 mg/dL
HEMOGLOBIN A1C: 5.7 % — AB (ref 4.8–5.6)

## 2015-08-15 LAB — VITAMIN D 25 HYDROXY (VIT D DEFICIENCY, FRACTURES): Vit D, 25-Hydroxy: 43.3 ng/mL (ref 30.0–100.0)

## 2015-08-15 NOTE — Telephone Encounter (Signed)
-----   Message from Adline Potter, MD sent at 08/15/2015  8:13 AM EDT ----- Inform prediabetes control improved and vit D level now normal, recommend continue current supplement

## 2015-08-15 NOTE — Telephone Encounter (Signed)
Spoke with patient. Patient advised of all results and verbalized understanding. Will call back with any future questions or concerns. MAH  

## 2015-08-15 NOTE — Progress Notes (Signed)
Date:  08/14/2015   Name:  Mallory Meyers   DOB:  [birth year]   MRN:  [id number]  PCP:  Adline Potter, MD    Chief Complaint: Follow-up and Migraine   History of Present Illness:  This is a 45 y.o. female seen in one month f/u. Verapamil was changed to metoprolol XL last visit, migraines improved, no se's noted. Weight down 1# on Contrave from previous PCP, requests refill. Still c/o R-sided LBP without radiation, Flexeril helps but makes drowsy, PT/chiro helped in past but says can't afford, on feet all day at work. Taking vit D supplement.  Review of Systems:  Review of Systems  Constitutional: Negative for fever and fatigue.  Respiratory: Negative for cough and shortness of breath.   Cardiovascular: Negative for chest pain and leg swelling.  Endocrine: Negative for polyuria.  Genitourinary: Negative for difficulty urinating.  Neurological: Negative for syncope and light-headedness.    Patient Active Problem List   Diagnosis Date Noted  . Hypertension 08/14/2015  . Right-sided low back pain without sciatica 08/14/2015  . Prediabetes 11/29/2014  . Vitamin D deficiency 11/29/2014  . Low HDL (under 40) 11/29/2014  . Obesity, Class II, BMI 35-39.9 11/29/2014  . Migraines 11/29/2014    Prior to Admission medications   Medication Sig Start Date End Date Taking? Authorizing Provider  Cholecalciferol (VITAMIN D) 2000 UNITS CAPS Take 1 capsule (2,000 Units total) by mouth daily. 11/29/14  Yes Adline Potter, MD  cyclobenzaprine (FLEXERIL) 10 MG tablet Take 1 tablet (10 mg total) by mouth at bedtime as needed for muscle spasms. 07/17/15  Yes Adline Potter, MD  metoprolol succinate (TOPROL-XL) 100 MG 24 hr tablet Take 1 tablet (100 mg total) by mouth daily. Take with or immediately following a meal. 08/14/15  Yes Adline Potter, MD  Multiple Vitamins-Minerals (ONE-A-DAY WOMENS 50 PLUS PO) Take 1 tablet by mouth daily at 12 noon.   Yes Historical Provider, MD  Naltrexone-Bupropion HCl ER  (CONTRAVE) 8-90 MG TB12 Take 1 tablet by mouth 2 (two) times daily.   Yes Historical Provider, MD  NUVARING 0.12-0.015 MG/24HR vaginal ring Take 1 tablet by mouth daily. 07/28/15  Yes Historical Provider, MD  triamterene-hydrochlorothiazide (MAXZIDE-25) 37.5-25 MG tablet Take 1 tablet by mouth daily. 07/28/15  Yes Historical Provider, MD  ZOLMitriptan (ZOMIG) 5 MG tablet Take 1 tablet (5 mg total) by mouth once as needed. May repeat in two hours as needed 06/16/15  Yes Adline Potter, MD  naproxen (NAPROSYN) 500 MG tablet Take 1 tablet (500 mg total) by mouth 2 (two) times daily with a meal. 08/14/15   Adline Potter, MD    No Known Allergies  Past Surgical History  Procedure Laterality Date  . Knee surgery Left   . Cholecystectomy    . Toe arthroplasty Right 12/23/2014    Procedure: TOE ARTHROPLASTY;  Surgeon: Sharlotte Alamo, MD;  Location: [facility] ORS;  Service: Podiatry;  Laterality: Right;    Social History  Substance Use Topics  . Smoking status: Never Smoker   . Smokeless tobacco: None  . Alcohol Use: 0.0 oz/week    0 Standard drinks or equivalent per week     Comment: occasional    Family History  Problem Relation Age of Onset  . Lung disease Mother     Medication list has been reviewed and updated.  Physical Examination: BP 106/79 mmHg  Pulse 84  Ht 5' (1.524 m)  Wt 188 lb 9.6 oz (85.548 kg)  BMI 36.83 kg/m2  Physical Exam  Constitutional: She appears well-developed and well-nourished.  Cardiovascular: Normal rate, regular rhythm and normal heart sounds.   Pulmonary/Chest: Effort normal and breath sounds normal.  Musculoskeletal: She exhibits no edema.  Moderate R paralumbar tenderness  Neurological: She is alert.  Skin: Skin is warm and dry.  Psychiatric: She has a normal mood and affect. Her behavior is normal.  Nursing note and vitals reviewed.   Assessment and Plan:  1. Intractable migraine without status migrainosus, unspecified migraine type Improved on  metoprolol XL, increase to 100 mg daily  2. Essential hypertension Well controlled on Maxide/metoprolol, consider d/c Maxide next visit  3. Prediabetes A1c 5.9% in Feb - HgB A1c  4. Right-sided low back pain without sciatica Unclear etiology, trial Naprosyn x 2 weeks, consider PT referral if ineffective  5. Vitamin D deficiency On supplement - Vitamin D (25 hydroxy)  6. Obesity, Class II, BMI 35-39.9 Weight improved, explained I cannot prescribe Contrave but ok to finish current rx   Return in about 4 weeks (around 09/11/2015).  Satira Anis. [facility]  08/15/2015

## 2015-09-11 ENCOUNTER — Encounter: Payer: Self-pay | Admitting: Family Medicine

## 2015-09-11 ENCOUNTER — Ambulatory Visit (INDEPENDENT_AMBULATORY_CARE_PROVIDER_SITE_OTHER): Payer: Managed Care, Other (non HMO) | Admitting: Family Medicine

## 2015-09-11 VITALS — BP 120/80 | HR 68 | Ht 60.0 in | Wt 190.0 lb

## 2015-09-11 DIAGNOSIS — I10 Essential (primary) hypertension: Secondary | ICD-10-CM

## 2015-09-11 DIAGNOSIS — E66812 Obesity, class 2: Secondary | ICD-10-CM

## 2015-09-11 DIAGNOSIS — E559 Vitamin D deficiency, unspecified: Secondary | ICD-10-CM

## 2015-09-11 DIAGNOSIS — E669 Obesity, unspecified: Secondary | ICD-10-CM | POA: Diagnosis not present

## 2015-09-11 DIAGNOSIS — M545 Low back pain, unspecified: Secondary | ICD-10-CM

## 2015-09-11 DIAGNOSIS — G43919 Migraine, unspecified, intractable, without status migrainosus: Secondary | ICD-10-CM | POA: Diagnosis not present

## 2015-09-11 DIAGNOSIS — R7303 Prediabetes: Secondary | ICD-10-CM

## 2015-09-11 NOTE — Progress Notes (Signed)
Date:  09/11/2015   Name:  Mallory Meyers   DOB:  Sep 21, 1970   MRN:  [id number]  PCP:  Adline Potter, MD    Chief Complaint: headaches   History of Present Illness:  This is a 45 y.o. female seen for one month f/u. Migraines significantly improved with increased metoprolol dose, only one in past month. Blood work last visit showed prediabetes improved and vit D level normal. LBP also improved, now taking both Naprosyn and Flexeril prn only.   Review of Systems:  Review of Systems  Constitutional: Negative for fever and fatigue.  Respiratory: Negative for cough and shortness of breath.   Cardiovascular: Negative for chest pain and leg swelling.  Endocrine: Negative for polyuria.  Genitourinary: Negative for difficulty urinating.  Neurological: Negative for syncope and light-headedness.    Patient Active Problem List   Diagnosis Date Noted  . Hypertension 08/14/2015  . Right-sided low back pain without sciatica 08/14/2015  . Prediabetes 11/29/2014  . Vitamin D deficiency 11/29/2014  . Low HDL (under 40) 11/29/2014  . Obesity, Class II, BMI 35-39.9 11/29/2014  . Migraines 11/29/2014    Prior to Admission medications   Medication Sig Start Date End Date Taking? Authorizing Provider  Cholecalciferol (VITAMIN D) 2000 UNITS CAPS Take 1 capsule (2,000 Units total) by mouth daily. 11/29/14  Yes Adline Potter, MD  cyclobenzaprine (FLEXERIL) 10 MG tablet Take 1 tablet (10 mg total) by mouth at bedtime as needed for muscle spasms. 07/17/15  Yes Adline Potter, MD  metoprolol succinate (TOPROL-XL) 100 MG 24 hr tablet Take 1 tablet (100 mg total) by mouth daily. Take with or immediately following a meal. 08/14/15  Yes Adline Potter, MD  Multiple Vitamins-Minerals (ONE-A-DAY WOMENS 50 PLUS PO) Take 1 tablet by mouth daily at 12 noon.   Yes Historical Provider, MD  Naltrexone-Bupropion HCl ER (CONTRAVE) 8-90 MG TB12 Take 1 tablet by mouth 2 (two) times daily.   Yes Historical Provider, MD  naproxen  (NAPROSYN) 500 MG tablet Take 1 tablet (500 mg total) by mouth 2 (two) times daily with a meal. 08/14/15  Yes Adline Potter, MD  NUVARING 0.12-0.015 MG/24HR vaginal ring Take 1 tablet by mouth daily. 07/28/15  Yes Historical Provider, MD  triamterene-hydrochlorothiazide (MAXZIDE-25) 37.5-25 MG tablet Take 1 tablet by mouth daily. 07/28/15  Yes Historical Provider, MD  ZOLMitriptan (ZOMIG) 5 MG tablet Take 1 tablet (5 mg total) by mouth once as needed. May repeat in two hours as needed 06/16/15  Yes Adline Potter, MD    No Known Allergies  Past Surgical History  Procedure Laterality Date  . Knee surgery Left   . Cholecystectomy    . Toe arthroplasty Right 12/23/2014    Procedure: TOE ARTHROPLASTY;  Surgeon: Sharlotte Alamo, MD;  Location: [facility] ORS;  Service: Podiatry;  Laterality: Right;    Social History  Substance Use Topics  . Smoking status: Never Smoker   . Smokeless tobacco: None  . Alcohol Use: 0.0 oz/week    0 Standard drinks or equivalent per week     Comment: occasional    Family History  Problem Relation Age of Onset  . Lung disease Mother     Medication list has been reviewed and updated.  Physical Examination: BP 120/80 mmHg  Pulse 68  Ht 5' (1.524 m)  Wt 190 lb (86.183 kg)  BMI 37.11 kg/m2  LMP   Physical Exam  Constitutional: She appears well-developed and well-nourished.  Cardiovascular: Normal rate, regular rhythm and normal heart sounds.  Pulmonary/Chest: Effort normal and breath sounds normal.  Musculoskeletal: She exhibits no edema.  Neurological: She is alert.  Skin: Skin is warm and dry.  Psychiatric: She has a normal mood and affect. Her behavior is normal.  Nursing note and vitals reviewed.   Assessment and Plan:  1. Intractable migraine without status migrainosus, unspecified migraine type Improved on increased metoprolol dose  2. Essential hypertension Well controlled on Maxide/metoprolol  3. Prediabetes Improved last visit, cont NCS  diet  4. Vitamin D deficiency Well controlled on supplement  5. Obesity, Class II, BMI 35-39.9 Weight up 2#, encouraged weight loss  6. Right-sided low back pain without sciatica Improved, cont prn Naprosyn/Flexeril  Return in about 6 months (around 03/13/2016).  Satira Anis. [facility]  09/11/2015

## 2015-10-17 ENCOUNTER — Other Ambulatory Visit: Payer: Self-pay | Admitting: Family Medicine

## 2015-10-17 MED ORDER — METOPROLOL SUCCINATE ER 100 MG PO TB24: 100.0000 mg | ORAL_TABLET | Freq: Every day | ORAL | Status: DC

## 2015-11-20 ENCOUNTER — Other Ambulatory Visit: Payer: Self-pay

## 2015-11-20 MED ORDER — NAPROXEN 500 MG PO TABS: 500.0000 mg | ORAL_TABLET | Freq: Two times a day (BID) | ORAL | 0 refills | Status: DC

## 2015-11-27 ENCOUNTER — Other Ambulatory Visit: Payer: Self-pay

## 2015-11-27 MED ORDER — TRIAMTERENE-HCTZ 37.5-25 MG PO TABS: 1.0000 | ORAL_TABLET | Freq: Every day | ORAL | 1 refills | Status: DC

## 2015-11-29 ENCOUNTER — Other Ambulatory Visit: Payer: Self-pay | Admitting: Internal Medicine

## 2015-12-02 ENCOUNTER — Other Ambulatory Visit: Payer: Self-pay | Admitting: Internal Medicine

## 2015-12-15 ENCOUNTER — Ambulatory Visit (INDEPENDENT_AMBULATORY_CARE_PROVIDER_SITE_OTHER): Payer: Managed Care, Other (non HMO) | Admitting: Internal Medicine

## 2015-12-15 ENCOUNTER — Encounter: Payer: Self-pay | Admitting: Internal Medicine

## 2015-12-15 VITALS — BP 118/80 | HR 98 | Resp 16 | Ht 60.0 in | Wt 197.0 lb

## 2015-12-15 DIAGNOSIS — R7303 Prediabetes: Secondary | ICD-10-CM | POA: Diagnosis not present

## 2015-12-15 DIAGNOSIS — Z0000 Encounter for general adult medical examination without abnormal findings: Secondary | ICD-10-CM | POA: Diagnosis not present

## 2015-12-15 DIAGNOSIS — G43919 Migraine, unspecified, intractable, without status migrainosus: Secondary | ICD-10-CM | POA: Diagnosis not present

## 2015-12-15 DIAGNOSIS — F39 Unspecified mood [affective] disorder: Secondary | ICD-10-CM | POA: Diagnosis not present

## 2015-12-15 DIAGNOSIS — I10 Essential (primary) hypertension: Secondary | ICD-10-CM | POA: Diagnosis not present

## 2015-12-15 DIAGNOSIS — E559 Vitamin D deficiency, unspecified: Secondary | ICD-10-CM

## 2015-12-15 MED ORDER — BUPROPION HCL ER (XL) 300 MG PO TB24: 300.0000 mg | ORAL_TABLET | Freq: Every day | ORAL | 5 refills | Status: DC

## 2015-12-15 MED ORDER — NARATRIPTAN HCL 2.5 MG PO TABS: 2.5000 mg | ORAL_TABLET | ORAL | 0 refills | Status: DC | PRN

## 2015-12-15 NOTE — Patient Instructions (Signed)
DASH Eating Plan  DASH stands for "Dietary Approaches to Stop Hypertension." The DASH eating plan is a healthy eating plan that has been shown to reduce high blood pressure (hypertension). Additional health benefits may include reducing the risk of type 2 diabetes mellitus, heart disease, and stroke. The DASH eating plan may also help with weight loss.  WHAT DO I NEED TO KNOW ABOUT THE DASH EATING PLAN?  For the DASH eating plan, you will follow these general guidelines:  · Choose foods with a percent daily value for sodium of less than 5% (as listed on the food label).  · Use salt-free seasonings or herbs instead of table salt or sea salt.  · Check with your health care provider or pharmacist before using salt substitutes.  · Eat lower-sodium products, often labeled as "lower sodium" or "no salt added."  · Eat fresh foods.  · Eat more vegetables, fruits, and low-fat dairy products.  · Choose whole grains. Look for the word "whole" as the first word in the ingredient list.  · Choose fish and skinless chicken or turkey more often than red meat. Limit fish, poultry, and meat to 6 oz (170 g) each day.  · Limit sweets, desserts, sugars, and sugary drinks.  · Choose heart-healthy fats.  · Limit cheese to 1 oz (28 g) per day.  · Eat more home-cooked food and less restaurant, buffet, and fast food.  · Limit fried foods.  · Cook foods using methods other than frying.  · Limit canned vegetables. If you do use them, rinse them well to decrease the sodium.  · When eating at a restaurant, ask that your food be prepared with less salt, or no salt if possible.  WHAT FOODS CAN I EAT?  Seek help from a dietitian for individual calorie needs.  Grains  Whole grain or whole wheat bread. Brown rice. Whole grain or whole wheat pasta. Quinoa, bulgur, and whole grain cereals. Low-sodium cereals. Corn or whole wheat flour tortillas. Whole grain cornbread. Whole grain crackers. Low-sodium crackers.  Vegetables  Fresh or frozen vegetables  (raw, steamed, roasted, or grilled). Low-sodium or reduced-sodium tomato and vegetable juices. Low-sodium or reduced-sodium tomato sauce and paste. Low-sodium or reduced-sodium canned vegetables.   Fruits  All fresh, canned (in natural juice), or frozen fruits.  Meat and Other Protein Products  Ground beef (85% or leaner), grass-fed beef, or beef trimmed of fat. Skinless chicken or turkey. Ground chicken or turkey. Pork trimmed of fat. All fish and seafood. Eggs. Dried beans, peas, or lentils. Unsalted nuts and seeds. Unsalted canned beans.  Dairy  Low-fat dairy products, such as skim or 1% milk, 2% or reduced-fat cheeses, low-fat ricotta or cottage cheese, or plain low-fat yogurt. Low-sodium or reduced-sodium cheeses.  Fats and Oils  Tub margarines without trans fats. Light or reduced-fat mayonnaise and salad dressings (reduced sodium). Avocado. Safflower, olive, or canola oils. Natural peanut or almond butter.  Other  Unsalted popcorn and pretzels.  The items listed above may not be a complete list of recommended foods or beverages. Contact your dietitian for more options.  WHAT FOODS ARE NOT RECOMMENDED?  Grains  White bread. White pasta. White rice. Refined cornbread. Bagels and croissants. Crackers that contain trans fat.  Vegetables  Creamed or fried vegetables. Vegetables in a cheese sauce. Regular canned vegetables. Regular canned tomato sauce and paste. Regular tomato and vegetable juices.  Fruits  Dried fruits. Canned fruit in light or heavy syrup. Fruit juice.  Meat and Other Protein   Products  Fatty cuts of meat. Ribs, chicken wings, bacon, sausage, bologna, salami, chitterlings, fatback, hot dogs, bratwurst, and packaged luncheon meats. Salted nuts and seeds. Canned beans with salt.  Dairy  Whole or 2% milk, cream, half-and-half, and cream cheese. Whole-fat or sweetened yogurt. Full-fat cheeses or blue cheese. Nondairy creamers and whipped toppings. Processed cheese, cheese spreads, or cheese  curds.  Condiments  Onion and garlic salt, seasoned salt, table salt, and sea salt. Canned and packaged gravies. Worcestershire sauce. Tartar sauce. Barbecue sauce. Teriyaki sauce. Soy sauce, including reduced sodium. Steak sauce. Fish sauce. Oyster sauce. Cocktail sauce. Horseradish. Ketchup and mustard. Meat flavorings and tenderizers. Bouillon cubes. Hot sauce. Tabasco sauce. Marinades. Taco seasonings. Relishes.  Fats and Oils  Butter, stick margarine, lard, shortening, ghee, and bacon fat. Coconut, palm kernel, or palm oils. Regular salad dressings.  Other  Pickles and olives. Salted popcorn and pretzels.  The items listed above may not be a complete list of foods and beverages to avoid. Contact your dietitian for more information.  WHERE CAN I FIND MORE INFORMATION?  National Heart, Lung, and Blood Institute: www.nhlbi.nih.gov/health/health-topics/topics/dash/     This information is not intended to replace advice given to you by your health care provider. Make sure you discuss any questions you have with your health care provider.     Document Released: 04/04/2011 Document Revised: 05/06/2014 Document Reviewed: 02/17/2013  Elsevier Interactive Patient Education ©2016 Elsevier Inc.

## 2015-12-15 NOTE — Progress Notes (Signed)
Date:  12/15/2015   Name:  Mallory Meyers   DOB:  1970/09/29   MRN:  [id number]   Chief Complaint: Annual Exam (Insurance premium Phys) Recent pap and pelvic at GYN in November.  Mammogram due this fall.  No breast complaints.  Labs done at work were okay.  Trig 151, HDL 46, weight high. Hypertension  Chronicity: borderline BP - good at recent work screening. Pertinent negatives include no chest pain or shortness of breath.  Migraine   This is a recurrent problem. The problem has been gradually worsening (becoming more frequent). The pain quality is similar to prior headaches. Pertinent negatives include no abdominal pain, back pain, coughing, fever, hearing loss or tinnitus. Her past medical history is significant for hypertension.  Depression         This is a recurrent problem.  The current episode started 1 to 4 weeks ago.   The onset quality is gradual.   Associated symptoms include irritable and appetite change.  Associated symptoms include no fatigue.     The symptoms are aggravated by work stress, social issues and family issues.  Past treatments include nothing.  Pre-diabetes - has gained some weight due to the above stress - divorce, tax issues, work, [employer] research officer, government. she has previously taken the weight loss drug Contrave without side effects.  Vitamin D def - on supplementation.  Lab Results  Component Value Date   CHOL 155 09/16/2014   HDL 56 09/16/2014   HDL 36 09/16/2014   LDLCALC 96 09/16/2014   TRIG 117 09/16/2014   Lab Results  Component Value Date   CREATININE 0.94 06/16/2015   Lab Results  Component Value Date   TSH 1.450 06/16/2015   Lab Results  Component Value Date   HGBA1C 5.7 (H) 08/14/2015   Wt Readings from Last 3 Encounters:  12/15/15 197 lb (89.4 kg)  09/11/15 190 lb (86.2 kg)  08/14/15 188 lb 9.6 oz (85.5 kg)     Review of Systems  Constitutional: Positive for appetite change and unexpected weight change. Negative for fatigue and fever.  HENT:  Negative for hearing loss, tinnitus and trouble swallowing.   Eyes: Negative for visual disturbance.  Respiratory: Negative for cough, chest tightness and shortness of breath.   Cardiovascular: Negative for chest pain and leg swelling.  Gastrointestinal: Negative for abdominal pain and blood in stool.  Endocrine: Negative for polyuria.  Genitourinary: Negative for difficulty urinating.  Musculoskeletal: Negative for arthralgias, back pain and gait problem.  Skin: Negative for color change and rash.  Neurological: Negative for syncope and light-headedness.  Hematological: Negative for adenopathy. Does not bruise/bleed easily.  Psychiatric/Behavioral: Positive for depression and dysphoric mood. Negative for sleep disturbance.    Patient Active Problem List   Diagnosis Date Noted  . Hypertension 08/14/2015  . Right-sided low back pain without sciatica 08/14/2015  . Prediabetes 11/29/2014  . Vitamin D deficiency 11/29/2014  . Low HDL (under 40) 11/29/2014  . Obesity, Class II, BMI 35-39.9 11/29/2014  . Migraines 11/29/2014    Prior to Admission medications   Medication Sig Start Date End Date Taking? Authorizing Provider  Cholecalciferol (VITAMIN D) 2000 UNITS CAPS Take 1 capsule (2,000 Units total) by mouth daily. 11/29/14  Yes Adline Potter, MD  cyclobenzaprine (FLEXERIL) 10 MG tablet Take 1 tablet (10 mg total) by mouth at bedtime as needed for muscle spasms. 07/17/15  Yes Adline Potter, MD  metoprolol succinate (TOPROL-XL) 100 MG 24 hr tablet Take 1 tablet (100 mg total)  by mouth daily. Take with or immediately following a meal. 10/17/15  Yes Adline Potter, MD  Multiple Vitamins-Minerals (ONE-A-DAY WOMENS 50 PLUS PO) Take 1 tablet by mouth daily at 12 noon.   Yes Historical Provider, MD  naproxen (NAPROSYN) 500 MG tablet TAKE 1 TABLET (500 MG TOTAL) BY MOUTH 2 (TWO) TIMES DAILY WITH A MEAL. 12/02/15  Yes Glean Hess, MD  NUVARING 0.12-0.015 MG/24HR vaginal ring Take 1 tablet by mouth  daily. 07/28/15  Yes Historical Provider, MD  triamterene-hydrochlorothiazide (MAXZIDE-25) 37.5-25 MG tablet Take 1 tablet by mouth daily. 11/30/15  Yes Glean Hess, MD  ZOLMitriptan (ZOMIG) 5 MG tablet Take 1 tablet (5 mg total) by mouth once as needed. May repeat in two hours as needed 06/16/15  Yes Adline Potter, MD  Naltrexone-Bupropion HCl ER (CONTRAVE) 8-90 MG TB12 Take 1 tablet by mouth 2 (two) times daily.    Historical Provider, MD    No Known Allergies  Past Surgical History:  Procedure Laterality Date  . CHOLECYSTECTOMY    . KNEE SURGERY Left   . TOE ARTHROPLASTY Right 12/23/2014   Procedure: TOE ARTHROPLASTY;  Surgeon: Sharlotte Alamo, MD;  Location: [facility] ORS;  Service: Podiatry;  Laterality: Right;    Social History  Substance Use Topics  . Smoking status: Never Smoker  . Smokeless tobacco: Never Used  . Alcohol use 0.0 oz/week     Comment: occasional     Medication list has been reviewed and updated.   Physical Exam  Constitutional: She is oriented to person, place, and time. She appears well-developed. She is irritable. No distress.  HENT:  Head: Normocephalic and atraumatic.  Neck: Normal range of motion. Neck supple. Carotid bruit is not present. No thyromegaly present.  Cardiovascular: Normal rate, regular rhythm and normal heart sounds.   Pulmonary/Chest: Effort normal and breath sounds normal. No respiratory distress.  Musculoskeletal: She exhibits no edema or tenderness.  Lymphadenopathy:    She has no cervical adenopathy.  Neurological: She is alert and oriented to person, place, and time.  Skin: Skin is warm and dry. No rash noted.  Psychiatric: She has a normal mood and affect. Her behavior is normal. Thought content normal.  Nursing note and vitals reviewed.   BP 118/80   Pulse 98   Resp 16   Ht 5' (1.524 m)   Wt 197 lb (89.4 kg)   SpO2 100%   BMI 38.47 kg/m   Assessment and Plan: 1. Annual physical exam Pap done by GYN Mammogram due this  fall Discussed exercise and dietary changes for weight management  2. Intractable migraine without status migrainosus, unspecified migraine type Try amerge as a longer acting agent - naratriptan (AMERGE) 2.5 MG tablet; Take 1 tablet (2.5 mg total) by mouth as needed for migraine. Take one (1) tablet at onset of headache; may repeat in 4 hours if needed  Dispense: 10 tablet; Refill: 0  3. Essential hypertension controlled  4. Vitamin D deficiency supplemented  5. Prediabetes Recent glucose normal  6. Mood disorder (HCC) - buPROPion (WELLBUTRIN XL) 300 MG 24 hr tablet; Take 1 tablet (300 mg total) by mouth daily.  Dispense: 30 tablet; Refill: [place], MD [facility]  12/15/2015

## 2016-02-12 ENCOUNTER — Other Ambulatory Visit: Payer: Self-pay | Admitting: Internal Medicine

## 2016-02-12 DIAGNOSIS — F39 Unspecified mood [affective] disorder: Secondary | ICD-10-CM

## 2016-02-12 MED ORDER — BUPROPION HCL ER (XL) 300 MG PO TB24: 300.0000 mg | ORAL_TABLET | Freq: Every day | ORAL | 3 refills | Status: DC

## 2016-03-11 ENCOUNTER — Ambulatory Visit (INDEPENDENT_AMBULATORY_CARE_PROVIDER_SITE_OTHER): Payer: Managed Care, Other (non HMO) | Admitting: Internal Medicine

## 2016-03-11 ENCOUNTER — Ambulatory Visit: Payer: Managed Care, Other (non HMO) | Admitting: Internal Medicine

## 2016-03-11 ENCOUNTER — Encounter: Payer: Self-pay | Admitting: Internal Medicine

## 2016-03-11 VITALS — BP 110/76 | HR 86 | Resp 16 | Ht 60.0 in | Wt 201.0 lb

## 2016-03-11 DIAGNOSIS — F39 Unspecified mood [affective] disorder: Secondary | ICD-10-CM | POA: Insufficient documentation

## 2016-03-11 DIAGNOSIS — Z23 Encounter for immunization: Secondary | ICD-10-CM

## 2016-03-11 DIAGNOSIS — G43919 Migraine, unspecified, intractable, without status migrainosus: Secondary | ICD-10-CM | POA: Diagnosis not present

## 2016-03-11 MED ORDER — ZOLMITRIPTAN 5 MG PO TABS: 5.0000 mg | ORAL_TABLET | Freq: Once | ORAL | 12 refills | Status: DC | PRN

## 2016-03-11 MED ORDER — NARATRIPTAN HCL 2.5 MG PO TABS: 2.5000 mg | ORAL_TABLET | ORAL | 12 refills | Status: DC | PRN

## 2016-03-11 NOTE — Progress Notes (Signed)
Date:  03/11/2016   Name:  Mallory Meyers   DOB:  [birth year]   MRN:  [id number]   Chief Complaint: Migraine (Having about 2-3 migraines a week but meds help. ) Migraine   This is a chronic problem. The current episode started more than 1 year ago. The problem has been gradually improving. Pertinent negatives include no fever. She has tried triptans (last visit changed to [place]) for the symptoms. Her past medical history is significant for migraine headaches.   Mood disorder - started on Wellbutrin last visit. She feels less stressed and situations are improved.   Review of Systems  Constitutional: Negative for chills, fatigue, fever and unexpected weight change.  Eyes: Negative for visual disturbance.  Respiratory: Negative for chest tightness and shortness of breath.   Cardiovascular: Negative for chest pain and leg swelling.  Neurological: Positive for headaches. Negative for tremors and syncope.  Psychiatric/Behavioral: Positive for dysphoric mood (improved). Negative for sleep disturbance.    Patient Active Problem List   Diagnosis Date Noted  . Hypertension 08/14/2015  . Right-sided low back pain without sciatica 08/14/2015  . Prediabetes 11/29/2014  . Vitamin D deficiency 11/29/2014  . Low HDL (under 40) 11/29/2014  . Obesity, Class II, BMI 35-39.9 11/29/2014  . Migraines 11/29/2014    Prior to Admission medications   Medication Sig Start Date End Date Taking? Authorizing Provider  buPROPion (WELLBUTRIN XL) 300 MG 24 hr tablet Take 1 tablet (300 mg total) by mouth daily. 02/12/16   Glean Hess, MD  Cholecalciferol (VITAMIN D) 2000 UNITS CAPS Take 1 capsule (2,000 Units total) by mouth daily. 11/29/14   Adline Potter, MD  cyclobenzaprine (FLEXERIL) 10 MG tablet Take 1 tablet (10 mg total) by mouth at bedtime as needed for muscle spasms. 07/17/15   Adline Potter, MD  metoprolol succinate (TOPROL-XL) 100 MG 24 hr tablet Take 1 tablet (100 mg total) by mouth daily. Take  with or immediately following a meal. 10/17/15   Adline Potter, MD  Multiple Vitamins-Minerals (ONE-A-DAY WOMENS 50 PLUS PO) Take 1 tablet by mouth daily at 12 noon.    Historical Provider, MD  naproxen (NAPROSYN) 500 MG tablet TAKE 1 TABLET (500 MG TOTAL) BY MOUTH 2 (TWO) TIMES DAILY WITH A MEAL. 12/02/15   Glean Hess, MD  naratriptan (AMERGE) 2.5 MG tablet Take 1 tablet (2.5 mg total) by mouth as needed for migraine. Take one (1) tablet at onset of headache; may repeat in 4 hours if needed 12/15/15   Glean Hess, MD  NUVARING 0.12-0.015 MG/24HR vaginal ring Take 1 tablet by mouth daily. 07/28/15   Historical Provider, MD  triamterene-hydrochlorothiazide (MAXZIDE-25) 37.5-25 MG tablet Take 1 tablet by mouth daily. 11/30/15   Glean Hess, MD  ZOLMitriptan (ZOMIG) 5 MG tablet Take 1 tablet (5 mg total) by mouth once as needed. May repeat in two hours as needed 06/16/15   Adline Potter, MD    No Known Allergies  Past Surgical History:  Procedure Laterality Date  . CHOLECYSTECTOMY    . KNEE SURGERY Left   . TOE ARTHROPLASTY Right 12/23/2014   Procedure: TOE ARTHROPLASTY;  Surgeon: Sharlotte Alamo, MD;  Location: [facility] ORS;  Service: Podiatry;  Laterality: Right;    Social History  Substance Use Topics  . Smoking status: Never Smoker  . Smokeless tobacco: Never Used  . Alcohol use 0.0 oz/week     Comment: occasional     Medication list has been reviewed and updated.  Physical Exam  Constitutional: She is oriented to person, place, and time. She appears well-developed. No distress.  HENT:  Head: Normocephalic and atraumatic.  Neck: Carotid bruit is not present.  Cardiovascular: Normal rate, regular rhythm and normal heart sounds.   Pulmonary/Chest: Effort normal and breath sounds normal. No respiratory distress.  Musculoskeletal: Normal range of motion.  Neurological: She is alert and oriented to person, place, and time.  Skin: Skin is warm and dry. No rash noted.  Psychiatric:  She has a normal mood and affect. Her speech is normal and behavior is normal. Thought content normal.  Nursing note and vitals reviewed.   BP 110/76   Pulse 86   Resp 16   Ht 5' (1.524 m)   Wt 201 lb (91.2 kg)   SpO2 100%   BMI 39.26 kg/m   Assessment and Plan: 1. Encounter for immunization - Flu Vaccine QUAD 36+ mos IM  2. Intractable migraine without status migrainosus, unspecified migraine type Continue current therapy - naratriptan (AMERGE) 2.5 MG tablet; Take 1 tablet (2.5 mg total) by mouth as needed for migraine. Take one (1) tablet at onset of headache; may repeat in 4 hours if needed  Dispense: 10 tablet; Refill: 12 - zolmitriptan (ZOMIG) 5 MG tablet; Take 1 tablet (5 mg total) by mouth once as needed. May repeat in two hours as needed  Dispense: 12 tablet; Refill: 12  3. Mood disorder ([place]) Improved - continue Wellbutrin   Halina Maidens, MD [facility]  03/11/2016

## 2016-05-20 ENCOUNTER — Encounter: Payer: Self-pay | Admitting: Internal Medicine

## 2016-05-20 ENCOUNTER — Ambulatory Visit (INDEPENDENT_AMBULATORY_CARE_PROVIDER_SITE_OTHER): Payer: Managed Care, Other (non HMO) | Admitting: Internal Medicine

## 2016-05-20 VITALS — BP 116/88 | HR 82 | Temp 97.6°F | Ht 60.0 in | Wt 199.0 lb

## 2016-05-20 DIAGNOSIS — J111 Influenza due to unidentified influenza virus with other respiratory manifestations: Secondary | ICD-10-CM | POA: Diagnosis not present

## 2016-05-20 MED ORDER — OSELTAMIVIR PHOSPHATE 75 MG PO CAPS: 75.0000 mg | ORAL_CAPSULE | Freq: Two times a day (BID) | ORAL | 0 refills | Status: DC

## 2016-05-20 MED ORDER — GUAIFENESIN-CODEINE 100-10 MG/5ML PO SYRP: 5.0000 mL | ORAL_SOLUTION | Freq: Three times a day (TID) | ORAL | 0 refills | Status: DC | PRN

## 2016-05-20 NOTE — Progress Notes (Signed)
Date:  05/20/2016   Name:  Mallory Meyers   DOB:  [birth year]   MRN:  [id number]   Chief Complaint: Fever (Pt stated coughing, fever, body ache) Fever   This is a new problem. The current episode started yesterday. Her temperature was unmeasured prior to arrival. Associated symptoms include coughing and muscle aches. Pertinent negatives include no abdominal pain, chest pain, diarrhea, vomiting or wheezing. She has tried acetaminophen for the symptoms. The treatment provided moderate relief.      Review of Systems  Constitutional: Positive for chills, fatigue and fever.  Respiratory: Positive for cough. Negative for chest tightness and wheezing.   Cardiovascular: Negative for chest pain and palpitations.  Gastrointestinal: Negative for abdominal pain, diarrhea and vomiting.  Musculoskeletal: Negative for myalgias.    Patient Active Problem List   Diagnosis Date Noted  . Mood disorder ([place]) 03/11/2016  . Hypertension 08/14/2015  . Right-sided low back pain without sciatica 08/14/2015  . Prediabetes 11/29/2014  . Vitamin D deficiency 11/29/2014  . Low HDL (under 40) 11/29/2014  . Obesity, Class II, BMI 35-39.9 11/29/2014  . Migraines 11/29/2014    Prior to Admission medications   Medication Sig Start Date End Date Taking? Authorizing Provider  buPROPion (WELLBUTRIN XL) 300 MG 24 hr tablet Take 1 tablet (300 mg total) by mouth daily. 02/12/16  Yes Glean Hess, MD  Cholecalciferol (VITAMIN D) 2000 UNITS CAPS Take 1 capsule (2,000 Units total) by mouth daily. 11/29/14  Yes Adline Potter, MD  cyclobenzaprine (FLEXERIL) 10 MG tablet Take 1 tablet (10 mg total) by mouth at bedtime as needed for muscle spasms. 07/17/15  Yes Adline Potter, MD  metoprolol succinate (TOPROL-XL) 100 MG 24 hr tablet Take 1 tablet (100 mg total) by mouth daily. Take with or immediately following a meal. 10/17/15  Yes Adline Potter, MD  Multiple Vitamins-Minerals (ONE-A-DAY WOMENS 50 PLUS PO) Take 1 tablet by  mouth daily at 12 noon.   Yes Historical Provider, MD  naproxen (NAPROSYN) 500 MG tablet TAKE 1 TABLET (500 MG TOTAL) BY MOUTH 2 (TWO) TIMES DAILY WITH A MEAL. 12/02/15  Yes Glean Hess, MD  naratriptan (AMERGE) 2.5 MG tablet Take 1 tablet (2.5 mg total) by mouth as needed for migraine. Take one (1) tablet at onset of headache; may repeat in 4 hours if needed 03/11/16  Yes Glean Hess, MD  NUVARING 0.12-0.015 MG/24HR vaginal ring Take 1 tablet by mouth daily. 07/28/15  Yes Historical Provider, MD  triamterene-hydrochlorothiazide (MAXZIDE-25) 37.5-25 MG tablet Take 1 tablet by mouth daily. 11/30/15  Yes Glean Hess, MD  zolmitriptan (ZOMIG) 5 MG tablet Take 1 tablet (5 mg total) by mouth once as needed. May repeat in two hours as needed 03/11/16  Yes Glean Hess, MD    No Known Allergies  Past Surgical History:  Procedure Laterality Date  . CHOLECYSTECTOMY    . KNEE SURGERY Left   . TOE ARTHROPLASTY Right 12/23/2014   Procedure: TOE ARTHROPLASTY;  Surgeon: Sharlotte Alamo, MD;  Location: [facility] ORS;  Service: Podiatry;  Laterality: Right;    Social History  Substance Use Topics  . Smoking status: Never Smoker  . Smokeless tobacco: Never Used  . Alcohol use 0.0 oz/week     Comment: occasional     Medication list has been reviewed and updated.   Physical Exam  Constitutional: She is oriented to person, place, and time. Vital signs are normal. She appears well-developed. She appears ill. No distress.  HENT:  Head: Normocephalic and atraumatic.  Right Ear: Tympanic membrane and ear canal normal.  Left Ear: Tympanic membrane and ear canal normal.  Nose: Right sinus exhibits no maxillary sinus tenderness. Left sinus exhibits no maxillary sinus tenderness.  Mouth/Throat: Posterior oropharyngeal erythema present. No posterior oropharyngeal edema.  Cardiovascular: Normal rate, regular rhythm and normal heart sounds.   Pulmonary/Chest: Effort normal and breath sounds normal. No  respiratory distress. She has no wheezes. She has no rhonchi.  Musculoskeletal: Normal range of motion.  Lymphadenopathy:       Head (right side): No submental and no submandibular adenopathy present.       Head (left side): No submental and no submandibular adenopathy present.  Neurological: She is alert and oriented to person, place, and time.  Skin: Skin is warm and dry. No rash noted.  Psychiatric: She has a normal mood and affect. Her behavior is normal. Thought content normal.  Nursing note and vitals reviewed.   BP 116/88   Pulse 82   Temp 97.6 F (36.4 C)   Ht 5' (1.524 m)   Wt 199 lb (90.3 kg)   SpO2 99%   BMI 38.86 kg/m   Assessment and Plan: 1. Influenza Take advil or tylenol, increase fluids and rest - oseltamivir (TAMIFLU) 75 MG capsule; Take 1 capsule (75 mg total) by mouth 2 (two) times daily.  Dispense: 10 capsule; Refill: 0 - guaiFENesin-codeine (ROBITUSSIN AC) 100-10 MG/5ML syrup; Take 5 mLs by mouth 3 (three) times daily as needed for cough.  Dispense: 150 mL; Refill: 0   Halina Maidens, MD [facility]  05/20/2016

## 2016-05-31 ENCOUNTER — Other Ambulatory Visit: Payer: Self-pay | Admitting: Internal Medicine

## 2016-06-12 ENCOUNTER — Telehealth: Payer: Self-pay

## 2016-06-12 ENCOUNTER — Other Ambulatory Visit: Payer: Self-pay | Admitting: Internal Medicine

## 2016-06-12 DIAGNOSIS — J40 Bronchitis, not specified as acute or chronic: Secondary | ICD-10-CM

## 2016-06-12 MED ORDER — DOXYCYCLINE HYCLATE 100 MG PO TABS: 100.0000 mg | ORAL_TABLET | Freq: Two times a day (BID) | ORAL | 0 refills | Status: DC

## 2016-06-12 MED ORDER — BENZONATATE 200 MG PO CAPS: 200.0000 mg | ORAL_CAPSULE | Freq: Two times a day (BID) | ORAL | 0 refills | Status: DC | PRN

## 2016-06-12 NOTE — Telephone Encounter (Signed)
Called pt and let her know medication was sent to pharmacy, and Dr. Army Melia Instructions.

## 2016-06-12 NOTE — Telephone Encounter (Signed)
Pt called reporting she finished Tamiflu but still has sore throat, and painful cough. Was requesting to get a different medication sent in to treat these symptoms. Her pharmacy is [facility] in [place]. Advice please?

## 2016-06-12 NOTE — Telephone Encounter (Signed)
Sent Rx for bronchitis and cough to [facility].  If not resolved after treatment, will need another OV.

## 2016-06-14 ENCOUNTER — Ambulatory Visit (INDEPENDENT_AMBULATORY_CARE_PROVIDER_SITE_OTHER): Payer: Managed Care, Other (non HMO) | Admitting: Internal Medicine

## 2016-06-14 ENCOUNTER — Encounter: Payer: Self-pay | Admitting: Internal Medicine

## 2016-06-14 VITALS — BP 130/88 | HR 102 | Temp 98.2°F | Ht 60.0 in | Wt 213.0 lb

## 2016-06-14 DIAGNOSIS — J029 Acute pharyngitis, unspecified: Secondary | ICD-10-CM | POA: Diagnosis not present

## 2016-06-14 DIAGNOSIS — J111 Influenza due to unidentified influenza virus with other respiratory manifestations: Secondary | ICD-10-CM

## 2016-06-14 DIAGNOSIS — B309 Viral conjunctivitis, unspecified: Secondary | ICD-10-CM | POA: Diagnosis not present

## 2016-06-14 MED ORDER — NEOMYCIN-POLYMYXIN-DEXAMETH 3.5-10000-0.1 OP SUSP: 2.0000 [drp] | Freq: Four times a day (QID) | OPHTHALMIC | 0 refills | Status: DC

## 2016-06-14 MED ORDER — AMOXICILLIN 875 MG PO TABS: 875.0000 mg | ORAL_TABLET | Freq: Two times a day (BID) | ORAL | 0 refills | Status: DC

## 2016-06-14 MED ORDER — GUAIFENESIN-CODEINE 100-10 MG/5ML PO SYRP: 5.0000 mL | ORAL_SOLUTION | Freq: Three times a day (TID) | ORAL | 0 refills | Status: DC | PRN

## 2016-06-14 NOTE — Progress Notes (Signed)
Date:  06/14/2016   Name:  Mallory Meyers   DOB:  May 09, 1970   MRN:  [id number]   Chief Complaint: Sore Throat (difficulty swallowing, and breaking out in sweats. cough w/ no production.) Sore Throat   This is a new problem. The current episode started yesterday. The problem has been gradually worsening. Neither side of throat is experiencing more pain than the other. There has been no fever. The pain is severe. Associated symptoms include coughing, headaches, a hoarse voice, swollen glands and trouble swallowing. Pertinent negatives include no abdominal pain or congestion. She has tried NSAIDs for the symptoms. The treatment provided mild relief.  Conjunctivitis   The current episode started 2 days ago. The onset was gradual. The problem has been unchanged. The problem is moderate. Associated symptoms include a fever, headaches, sore throat, swollen glands, cough and eye redness. Pertinent negatives include no eye itching, no abdominal pain, no congestion, no wheezing, no eye discharge and no eye pain.  She felt better after the tamiflu and the cough had improved somewhat.  Two days ago began to feel worse and developed very sore throat, cough and headache.  She has not been taking any anti-inflammatory medication.     Review of Systems  Constitutional: Positive for fever. Negative for chills, fatigue and unexpected weight change.  HENT: Positive for hoarse voice, sore throat and trouble swallowing. Negative for congestion.   Eyes: Positive for redness. Negative for pain, discharge, itching and visual disturbance.  Respiratory: Positive for cough. Negative for chest tightness and wheezing.   Cardiovascular: Negative for chest pain, palpitations and leg swelling.  Gastrointestinal: Negative for abdominal pain.  Neurological: Positive for headaches. Negative for dizziness and numbness.    Patient Active Problem List   Diagnosis Date Noted  . Mood disorder ([place]) 03/11/2016  . Hypertension  08/14/2015  . Right-sided low back pain without sciatica 08/14/2015  . Prediabetes 11/29/2014  . Vitamin D deficiency 11/29/2014  . Low HDL (under 40) 11/29/2014  . Obesity, Class II, BMI 35-39.9 11/29/2014  . Migraines 11/29/2014    Prior to Admission medications   Medication Sig Start Date End Date Taking? Authorizing Provider  benzonatate (TESSALON) 200 MG capsule Take 1 capsule (200 mg total) by mouth 2 (two) times daily as needed for cough. 06/12/16  Yes Glean Hess, MD  buPROPion (WELLBUTRIN XL) 300 MG 24 hr tablet Take 1 tablet (300 mg total) by mouth daily. 02/12/16  Yes Glean Hess, MD  Cholecalciferol (VITAMIN D) 2000 UNITS CAPS Take 1 capsule (2,000 Units total) by mouth daily. 11/29/14  Yes Adline Potter, MD  cyclobenzaprine (FLEXERIL) 10 MG tablet Take 1 tablet (10 mg total) by mouth at bedtime as needed for muscle spasms. 07/17/15  Yes Adline Potter, MD  doxycycline (VIBRA-TABS) 100 MG tablet Take 1 tablet (100 mg total) by mouth 2 (two) times daily. 06/12/16  Yes Glean Hess, MD  guaiFENesin-codeine [facility]) 100-10 MG/5ML syrup Take 5 mLs by mouth 3 (three) times daily as needed for cough. 05/20/16  Yes Glean Hess, MD  metoprolol succinate (TOPROL-XL) 100 MG 24 hr tablet Take 1 tablet (100 mg total) by mouth daily. Take with or immediately following a meal. 10/17/15  Yes Adline Potter, MD  Multiple Vitamins-Minerals (ONE-A-DAY WOMENS 50 PLUS PO) Take 1 tablet by mouth daily at 12 noon.   Yes Historical Provider, MD  naproxen (NAPROSYN) 500 MG tablet TAKE 1 TABLET (500 MG TOTAL) BY MOUTH 2 (TWO) TIMES DAILY  WITH A MEAL. 12/02/15  Yes Glean Hess, MD  naratriptan (AMERGE) 2.5 MG tablet Take 1 tablet (2.5 mg total) by mouth as needed for migraine. Take one (1) tablet at onset of headache; may repeat in 4 hours if needed 03/11/16  Yes Glean Hess, MD  NUVARING 0.12-0.015 MG/24HR vaginal ring Take 1 tablet by mouth daily. 07/28/15  Yes Historical Provider,  MD  triamterene-hydrochlorothiazide (MAXZIDE-25) 37.5-25 MG tablet TAKE 1 TABLET BY MOUTH DAILY. 05/31/16  Yes Glean Hess, MD  zolmitriptan (ZOMIG) 5 MG tablet Take 1 tablet (5 mg total) by mouth once as needed. May repeat in two hours as needed 03/11/16  Yes Glean Hess, MD    No Known Allergies  Past Surgical History:  Procedure Laterality Date  . CHOLECYSTECTOMY    . KNEE SURGERY Left   . TOE ARTHROPLASTY Right 12/23/2014   Procedure: TOE ARTHROPLASTY;  Surgeon: Sharlotte Alamo, MD;  Location: [facility] ORS;  Service: Podiatry;  Laterality: Right;    Social History  Substance Use Topics  . Smoking status: Never Smoker  . Smokeless tobacco: Never Used  . Alcohol use 0.0 oz/week     Comment: occasional     Medication list has been reviewed and updated.   Physical Exam  Constitutional: She is oriented to person, place, and time. She appears well-developed. No distress.  HENT:  Head: Normocephalic and atraumatic.  Right Ear: Tympanic membrane and ear canal normal.  Left Ear: Tympanic membrane and ear canal normal.  Nose: Right sinus exhibits maxillary sinus tenderness. Left sinus exhibits maxillary sinus tenderness.  Mouth/Throat: Posterior oropharyngeal erythema present.  Eyes: EOM are normal. Pupils are equal, round, and reactive to light. Right eye exhibits chemosis. Right eye exhibits no discharge. Left eye exhibits chemosis. Left eye exhibits no discharge.  Cardiovascular: Normal rate, regular rhythm and normal heart sounds.   Pulmonary/Chest: Effort normal and breath sounds normal. No respiratory distress. She has no wheezes.  Musculoskeletal: Normal range of motion.  Neurological: She is alert and oriented to person, place, and time.  Skin: Skin is warm and dry. No rash noted.  Psychiatric: She has a normal mood and affect. Her behavior is normal. Thought content normal.  Nursing note and vitals reviewed.   BP 130/88   Pulse (!) 102   Temp 98.2 F (36.8 C)   Ht 5'  (1.524 m)   Wt 213 lb (96.6 kg)   SpO2 100%   BMI 41.60 kg/m   Assessment and Plan: 1. Pharyngitis, unspecified etiology Take Advil ATC, drink sufficient fluids - guaiFENesin-codeine (ROBITUSSIN AC) 100-10 MG/5ML syrup; Take 5 mLs by mouth 3 (three) times daily as needed for cough.  Dispense: 150 mL; Refill: 0 - amoxicillin (AMOXIL) 875 MG tablet; Take 1 tablet (875 mg total) by mouth 2 (two) times daily.  Dispense: 20 tablet; Refill: 0  2. Influenza resolved  3. Acute viral conjunctivitis of right eye - neomycin-polymyxin b-dexamethasone (MAXITROL) 3.5-10000-0.1 SUSP; Place 2 drops into both eyes every 6 (six) hours.  Dispense: 5 mL; Refill: 0   Halina Maidens, MD [facility]  06/14/2016

## 2016-06-14 NOTE — Patient Instructions (Signed)
Ibuprofen 600mg  4 times a day   Lots of fluids  Stop Doxycycline

## 2016-07-09 ENCOUNTER — Ambulatory Visit (INDEPENDENT_AMBULATORY_CARE_PROVIDER_SITE_OTHER): Payer: 59 | Admitting: Internal Medicine

## 2016-07-09 ENCOUNTER — Encounter: Payer: Self-pay | Admitting: Internal Medicine

## 2016-07-09 ENCOUNTER — Other Ambulatory Visit: Payer: Self-pay

## 2016-07-09 VITALS — BP 124/74 | HR 75 | Temp 98.3°F | Ht 59.5 in | Wt 201.0 lb

## 2016-07-09 DIAGNOSIS — R7303 Prediabetes: Secondary | ICD-10-CM

## 2016-07-09 DIAGNOSIS — G43C1 Periodic headache syndromes in child or adult, intractable: Secondary | ICD-10-CM | POA: Diagnosis not present

## 2016-07-09 DIAGNOSIS — J309 Allergic rhinitis, unspecified: Secondary | ICD-10-CM | POA: Insufficient documentation

## 2016-07-09 DIAGNOSIS — I10 Essential (primary) hypertension: Secondary | ICD-10-CM | POA: Diagnosis not present

## 2016-07-09 DIAGNOSIS — J302 Other seasonal allergic rhinitis: Secondary | ICD-10-CM

## 2016-07-09 DIAGNOSIS — R002 Palpitations: Secondary | ICD-10-CM | POA: Diagnosis not present

## 2016-07-09 DIAGNOSIS — F339 Major depressive disorder, recurrent, unspecified: Secondary | ICD-10-CM | POA: Insufficient documentation

## 2016-07-09 DIAGNOSIS — E559 Vitamin D deficiency, unspecified: Secondary | ICD-10-CM | POA: Diagnosis not present

## 2016-07-09 DIAGNOSIS — D649 Anemia, unspecified: Secondary | ICD-10-CM | POA: Insufficient documentation

## 2016-07-09 LAB — COMPREHENSIVE METABOLIC PANEL
ALBUMIN: 4 g/dL (ref 3.5–5.2)
ALT: 14 U/L (ref 0–35)
AST: 16 U/L (ref 0–37)
Alkaline Phosphatase: 34 U/L — ABNORMAL LOW (ref 39–117)
BUN: 19 mg/dL (ref 6–23)
CALCIUM: 9.9 mg/dL (ref 8.4–10.5)
CHLORIDE: 102 meq/L (ref 96–112)
CO2: 29 mEq/L (ref 19–32)
CREATININE: 1.07 mg/dL (ref 0.40–1.20)
GFR: 70.77 mL/min (ref >60.00)
Glucose, Bld: 134 mg/dL — ABNORMAL HIGH (ref 70–99)
Potassium: 3.7 mEq/L (ref 3.5–5.1)
Sodium: 138 mEq/L (ref 135–145)
Total Bilirubin: 0.4 mg/dL (ref 0.2–1.2)
Total Protein: 7.2 g/dL (ref 6.0–8.3)

## 2016-07-09 LAB — CBC
HEMATOCRIT: 41.2 % (ref 36.0–46.0)
Hemoglobin: 13.7 g/dL (ref 12.0–15.0)
MCHC: 33.3 g/dL (ref 30.0–36.0)
MCV: 81.7 fl (ref 78.0–100.0)
PLATELETS: 278 10*3/uL (ref 150.0–400.0)
RBC: 5.05 Mil/uL (ref 3.87–5.11)
RDW: 12.8 % (ref 11.5–15.5)
WBC: 5.8 10*3/uL (ref 4.0–10.5)

## 2016-07-09 LAB — VITAMIN D 25 HYDROXY (VIT D DEFICIENCY, FRACTURES): VITD: 21.34 ng/mL — ABNORMAL LOW (ref 30.00–100.00)

## 2016-07-09 LAB — TSH: TSH: 1.56 u[IU]/mL (ref 0.35–4.50)

## 2016-07-09 LAB — HEMOGLOBIN A1C: HEMOGLOBIN A1C: 5.8 % (ref 4.6–6.5)

## 2016-07-09 MED ORDER — PREDNISONE 10 MG PO TABS: ORAL_TABLET | ORAL | 0 refills | Status: DC

## 2016-07-09 NOTE — Assessment & Plan Note (Signed)
Will check Vit D Today Continue Vit D OTC

## 2016-07-09 NOTE — Assessment & Plan Note (Signed)
CBC today Advised her to take iron supplement daily

## 2016-07-09 NOTE — Patient Instructions (Signed)
Anemia, Nonspecific Anemia is a condition in which the concentration of red blood cells or hemoglobin in the blood is below normal. Hemoglobin is a substance in red blood cells that carries oxygen to the tissues of the body. Anemia results in not enough oxygen reaching these tissues. What are the causes? Common causes of anemia include:  Excessive bleeding. Bleeding may be internal or external. This includes excessive bleeding from periods (in women) or from the intestine.  Poor nutrition.  Chronic kidney, thyroid, and liver disease.  Bone marrow disorders that decrease red blood cell production.  Cancer and treatments for cancer.  HIV, AIDS, and their treatments.  Spleen problems that increase red blood cell destruction.  Blood disorders.  Excess destruction of red blood cells due to infection, medicines, and autoimmune disorders. What are the signs or symptoms?  Minor weakness.  Dizziness.  Headache.  Palpitations.  Shortness of breath, especially with exercise.  Paleness.  Cold sensitivity.  Indigestion.  Nausea.  Difficulty sleeping.  Difficulty concentrating. Symptoms may occur suddenly or they may develop slowly. How is this diagnosed? Additional blood tests are often needed. These help your health care provider determine the best treatment. Your health care provider will check your stool for blood and look for other causes of blood loss. How is this treated? Treatment varies depending on the cause of the anemia. Treatment can include:  Supplements of iron, vitamin B12, or folic acid.  Hormone medicines.  A blood transfusion. This may be needed if blood loss is severe.  Hospitalization. This may be needed if there is significant continual blood loss.  Dietary changes.  Spleen removal. Follow these instructions at home: Keep all follow-up appointments. It often takes many weeks to correct anemia, and having your health care provider check on your  condition and your response to treatment is very important. Get help right away if:  You develop extreme weakness, shortness of breath, or chest pain.  You become dizzy or have trouble concentrating.  You develop heavy vaginal bleeding.  You develop a rash.  You have bloody or black, tarry stools.  You faint.  You vomit up blood.  You vomit repeatedly.  You have abdominal pain.  You have a fever or persistent symptoms for more than 2-3 days.  You have a fever and your symptoms suddenly get worse.  You are dehydrated. This information is not intended to replace advice given to you by your health care provider. Make sure you discuss any questions you have with your health care provider. Document Released: 05/23/2004 Document Revised: 09/27/2015 Document Reviewed: 10/09/2012 Elsevier Interactive Patient Education  2017 [employer]  

## 2016-07-09 NOTE — Assessment & Plan Note (Signed)
A1C today Encouraged her to work on diet and exercise

## 2016-07-09 NOTE — Assessment & Plan Note (Signed)
Deteriorated Stop Nasocort eRx for Pred Taper x 6 days Once finished with Prednisone, restart Nasocort Continue Allegra

## 2016-07-09 NOTE — Assessment & Plan Note (Signed)
Stable on Wellbutrin Will monitor

## 2016-07-09 NOTE — Assessment & Plan Note (Signed)
Controlled with Triamterene-HCTZ CMET today

## 2016-07-09 NOTE — Progress Notes (Signed)
HPI  Pt presents to the clinic today to establish care and for management of the conditions listed below. She is transferring care from [facility].   Seasonal Allergies: Usually worse in the spring. She also lives in a house with mold, which she thinks is a contributing factor. She is currently experiencing runny nose, sore throat and cough. She is blowing clear mucous out of her nose. She denies difficulty swallowing. The cough is nonproductive. She is taking Chemical engineer with some relief.  Anemia: She takes Iron supplements as needed, when she feels fatigued or cold. She has not noticed any active bleeding. She still has her menstrual cycles but they are not heavy, with the use of Nuvaring.  Depression: Triggered by life stress. She feels like her symptoms are well controlled on Wellbutrin. She does not see a therapist.  Migraines: These occur 2-3 times a week. She is taking Metoprolol with some relief. She alternates Amerge and Zomig as needed. She has not been evaluated by neurology for this.  HTN: Her BP today is 124/74. She is taking Maxide as directed, but reports this is for swelling not HTN.  History of Seizures: As a child. She has not had issues with this as an adult.   Prediabetes: She reports she has not had her labs checked in the last year. She is not taking in diabetic medication at this time.  Vit D Deficiency: She takes a Vit D supplement as prescribed.    Flu: 02/2016 Tetanus: 08/2014 Pap Smear: 02/2015, normal, [facility] Mammogram: 02/2015, normal, [facility] Vision Screening: annually Dentist: every 3 months  Past Medical History:  Diagnosis Date  . Anemia   . Depression   . Headache   . Hypertension   . Seizures ([place])    AS A CHILD    Current Outpatient Prescriptions  Medication Sig Dispense Refill  . buPROPion (WELLBUTRIN XL) 300 MG 24 hr tablet Take 1 tablet (300 mg total) by mouth daily. 90 tablet 3  . Cholecalciferol  (VITAMIN D) 2000 UNITS CAPS Take 1 capsule (2,000 Units total) by mouth daily. 30 capsule   . cyclobenzaprine (FLEXERIL) 10 MG tablet Take 1 tablet (10 mg total) by mouth at bedtime as needed for muscle spasms. 30 tablet 0  . metoprolol succinate (TOPROL-XL) 100 MG 24 hr tablet Take 1 tablet (100 mg total) by mouth daily. Take with or immediately following a meal. 90 tablet 3  . Multiple Vitamins-Minerals (ONE-A-DAY WOMENS 50 PLUS PO) Take 1 tablet by mouth daily at 12 noon.    . naproxen (NAPROSYN) 500 MG tablet TAKE 1 TABLET (500 MG TOTAL) BY MOUTH 2 (TWO) TIMES DAILY WITH A MEAL. 30 tablet 0  . naratriptan (AMERGE) 2.5 MG tablet Take 1 tablet (2.5 mg total) by mouth as needed for migraine. Take one (1) tablet at onset of headache; may repeat in 4 hours if needed 10 tablet 12  . neomycin-polymyxin b-dexamethasone (MAXITROL) 3.5-10000-0.1 SUSP Place 2 drops into both eyes every 6 (six) hours. 5 mL 0  . NUVARING 0.12-0.015 MG/24HR vaginal ring Take 1 tablet by mouth daily.  3  . triamterene-hydrochlorothiazide (MAXZIDE-25) 37.5-25 MG tablet TAKE 1 TABLET BY MOUTH DAILY. 90 tablet 1  . zolmitriptan (ZOMIG) 5 MG tablet Take 1 tablet (5 mg total) by mouth once as needed. May repeat in two hours as needed 12 tablet 12   No current facility-administered medications for this visit.     No Known Allergies  Family History  Problem Relation Age of Onset  . Lung disease Mother   . Hypertension Mother   . Kidney disease Mother   . Diabetes Mother   . Hypertension Maternal Grandmother   . Cancer Paternal Grandmother     Social History   Social History  . Marital status: Legally Separated    Spouse name: N/A  . Number of children: N/A  . Years of education: N/A   Occupational History  . Not on file.   Social History Main Topics  . Smoking status: Never Smoker  . Smokeless tobacco: Never Used  . Alcohol use 0.0 oz/week     Comment: occasional  . Drug use: No  . Sexual activity: Not on  file   Other Topics Concern  . Not on file   Social History Narrative  . No narrative on file    ROS:  Constitutional: Denies fever, malaise, fatigue, headache or abrupt weight changes.  HEENT: Pt reports runny nose, sore throat. Denies eye pain, eye redness, ear pain, ringing in the ears, wax buildup, nasal congestion, bloody nose. Respiratory: Pt reports cough. Denies difficulty breathing, shortness of breath, or sputum production.   Cardiovascular: Denies chest pain, chest tightness, palpitations or swelling in the hands or feet.  Gastrointestinal: Denies abdominal pain, bloating, constipation, diarrhea or blood in the stool.  GU: Denies frequency, urgency, pain with urination, blood in urine, odor or discharge. Musculoskeletal: Pt reports intermittent low back pain. Denies decrease in range of motion, difficulty with gait, or joint pain and swelling.  Skin: Denies redness, rashes, lesions or ulcercations.  Neurological: Denies dizziness, difficulty with memory, difficulty with speech or problems with balance and coordination.  Psych: Pt has history of depression. Denies anxiety, SI/HI.  No other specific complaints in a complete review of systems (except as listed in HPI above).  PE:  BP 124/74   Pulse 75   Temp 98.3 F (36.8 C) (Oral)   Ht 4' 11.5" (1.511 m)   Wt 201 lb (91.2 kg)   LMP 04/14/2016   SpO2 98%   BMI 39.92 kg/m  Wt Readings from Last 3 Encounters:  07/09/16 201 lb (91.2 kg)  06/14/16 213 lb (96.6 kg)  05/20/16 199 lb (90.3 kg)    General: Appears herstated age, obese in NAD. HEENT: Head: normal shape and size, no sinus tenderness noted; Ears: Tm's gray and intact, normal light reflex; Throat/Mouth: Teeth present, mucosa pink and moist, + PND, no lesions or ulcerations noted.  Neck: No adenopathy noted.  Cardiovascular: Normal rate and rhythm. Occasional extra beat noted.  No murmur, rubs or gallops noted. No JVD or BLE edema.  Pulmonary/Chest: Normal  effort and positive vesicular breath sounds. No respiratory distress. No wheezes, rales or ronchi noted.  Musculoskeletal: No difficulty with gait.  Neurological: Alert and oriented.  Psychiatric: Mood and affect normal. Behavior is normal. Judgment and thought content normal.    BMET    Component Value Date/Time   NA 136 06/16/2015 1442   NA 137 08/15/2012 0949   K 4.7 06/16/2015 1442   K 3.8 08/15/2012 0949   CL 100 06/16/2015 1442   CL 104 08/15/2012 0949   CO2 23 06/16/2015 1442   CO2 26 08/15/2012 0949   GLUCOSE 87 06/16/2015 1442   GLUCOSE 92 08/15/2012 0949   BUN 15 06/16/2015 1442   BUN 16 08/15/2012 0949   CREATININE 0.94 06/16/2015 1442   CREATININE 0.89 08/15/2012 0949   CALCIUM 9.1 06/16/2015 1442   CALCIUM 9.0 08/15/2012  [place] 06/16/2015 1442   GFRNONAA >60 08/15/2012 0949   GFRAA 85 06/16/2015 1442   GFRAA >60 08/15/2012 0949    Lipid Panel     Component Value Date/Time   CHOL 155 09/16/2014   TRIG 117 09/16/2014   HDL 56 09/16/2014   HDL 36 09/16/2014   LDLCALC 96 09/16/2014    CBC    Component Value Date/Time   WBC 6.6 06/16/2015 1442   WBC 6.8 12/22/2014 1052   RBC 4.94 06/16/2015 1442   RBC 5.11 12/22/2014 1052   HGB 14.0 12/22/2014 1052   HGB 14.6 08/15/2012 0949   HCT 40.4 06/16/2015 1442   PLT 302 06/16/2015 1442   MCV 82 06/16/2015 1442   MCV 82 08/15/2012 0949   MCH 27.5 06/16/2015 1442   MCH 27.4 12/22/2014 1052   MCHC 33.7 06/16/2015 1442   MCHC 33.3 12/22/2014 1052   RDW 13.0 06/16/2015 1442   RDW 12.7 08/15/2012 0949   LYMPHSABS 2.0 12/22/2014 1052   MONOABS 0.4 12/22/2014 1052   EOSABS 0.2 12/22/2014 1052   BASOSABS 0.1 12/22/2014 1052    Hgb A1C Lab Results  Component Value Date   HGBA1C 5.7 (H) 08/14/2015     Assessment and Plan:  Palpitations:  CMET and TSH today  Make an appt for your annual exam Webb Silversmith, NP

## 2016-07-09 NOTE — Assessment & Plan Note (Signed)
Chronic Improved on Metoprolol and Zomig/AMerge If worse, will refer to neurology

## 2016-07-11 MED ORDER — VITAMIN D (ERGOCALCIFEROL) 1.25 MG (50000 UNIT) PO CAPS: 50000.0000 [IU] | ORAL_CAPSULE | ORAL | 0 refills | Status: DC

## 2016-07-11 NOTE — Addendum Note (Signed)
Addended by: Lurlean Nanny on: 07/11/2016 09:40 AM   Modules accepted: Orders

## 2016-07-16 ENCOUNTER — Telehealth: Payer: Self-pay

## 2016-07-16 ENCOUNTER — Emergency Department (HOSPITAL_COMMUNITY)
Admission: EM | Admit: 2016-07-16 | Discharge: 2016-07-16 | Disposition: A | Discharge status: Home / Self Care | Payer: 59 | Attending: Emergency Medicine | Admitting: Emergency Medicine

## 2016-07-16 ENCOUNTER — Emergency Department (HOSPITAL_COMMUNITY): Payer: 59

## 2016-07-16 DIAGNOSIS — I10 Essential (primary) hypertension: Secondary | ICD-10-CM | POA: Diagnosis not present

## 2016-07-16 DIAGNOSIS — J069 Acute upper respiratory infection, unspecified: Secondary | ICD-10-CM | POA: Insufficient documentation

## 2016-07-16 DIAGNOSIS — Z79899 Other long term (current) drug therapy: Secondary | ICD-10-CM | POA: Insufficient documentation

## 2016-07-16 DIAGNOSIS — R05 Cough: Secondary | ICD-10-CM | POA: Diagnosis present

## 2016-07-16 DIAGNOSIS — B9789 Other viral agents as the cause of diseases classified elsewhere: Secondary | ICD-10-CM

## 2016-07-16 LAB — RAPID STREP SCREEN (MED CTR MEBANE ONLY): Streptococcus, Group A Screen (Direct): NEGATIVE

## 2016-07-16 MED ORDER — BENZONATATE 100 MG PO CAPS: 100.0000 mg | ORAL_CAPSULE | Freq: Three times a day (TID) | ORAL | 0 refills | Status: DC

## 2016-07-16 NOTE — Telephone Encounter (Signed)
Per chart review tab pt is at [place]. 

## 2016-07-16 NOTE — ED Triage Notes (Signed)
Pt arrives via POv from home with dry nonproductive cough and sore throat for the last several days. Pt denies recent fever. VSS. Lungs CTA.

## 2016-07-16 NOTE — Telephone Encounter (Signed)
noted 

## 2016-07-16 NOTE — Telephone Encounter (Signed)
Pt left v/m; pt was seen 07/09/16 and has just finished the prednisone prescribed and now pt is hoarse and S/T again; this has been going on for 2 months. Pt request cb. [facility] [place].

## 2016-07-16 NOTE — Discharge Instructions (Signed)
Your exam, x-ray and labs were reassuring. Continue taking OTC medications as we discussed. He may also use or Tessalon to help with cough. Continue with ibuprofen, cold mist humidifier. Follow-up with your doctor. Return to ED for new or worsening symptoms.

## 2016-07-16 NOTE — Telephone Encounter (Signed)
I wonder if this is silent reflux. Have her take Prilosec OTC daily x 14 days and update me with her symptoms.

## 2016-07-16 NOTE — Telephone Encounter (Signed)
Pt went to ED---pt is aware as instructed and expressed understanding... Will update in 2 weeks

## 2016-07-16 NOTE — ED Provider Notes (Signed)
[facility] DEPT Provider Note   CSN: [id number] Arrival date & time: 07/16/16  1053  By signing my name below, I, Reola Mosher, attest that this documentation has been prepared under the direction and in the presence of Comer Locket, PA-C.  Electronically Signed: Reola Mosher, ED Scribe. 07/16/16. 12:58 PM.  History   Chief Complaint Chief Complaint  Patient presents with  . Sore Throat  . Cough   The history is provided by the patient. No language interpreter was used.    HPI Comments: Mallory Meyers is a 46 y.o. female with a h/o anemia, HTN, and obesity, who presents to the Emergency Department complaining of persistent non-productive cough beginning approximately two months ago. She notes associated sore throat, hoarseness and subjective fever. Per pt, her symptoms have been ongoing and recurrent over the past several months. She has been seen several times for this issue, most recently one week ago. Pt has been prescribed Prednisone and Codeine from her prior clinical evaluations which she has been taking without relief. Pt has also been taking Mucinex at home without significant relief as well. She denies shortness of breath, leg swelling, rhinorrhea, or any other associated symptoms.   Past Medical History:  Diagnosis Date  . Anemia   . Depression   . Headache   . Hypertension   . Seizures ([place])    AS A CHILD   Patient Active Problem List   Diagnosis Date Noted  . Anemia 07/09/2016  . Allergic rhinitis due to allergen 07/09/2016  . Depression, recurrent ([place]) 07/09/2016  . Hypertension 08/14/2015  . Prediabetes 11/29/2014  . Vitamin D deficiency 11/29/2014  . Obesity, Class II, BMI 35-39.9 11/29/2014  . Intractable periodic headache syndrome 11/29/2014   Past Surgical History:  Procedure Laterality Date  . CHOLECYSTECTOMY    . KNEE SURGERY Left   . TOE ARTHROPLASTY Right 12/23/2014   Procedure: TOE ARTHROPLASTY;  Surgeon: Sharlotte Alamo, MD;   Location: [facility] ORS;  Service: Podiatry;  Laterality: Right;   OB History    No data available     Home Medications    Prior to Admission medications   Medication Sig Start Date End Date Taking? Authorizing Provider  benzonatate (TESSALON) 100 MG capsule Take 1 capsule (100 mg total) by mouth every 8 (eight) hours. 07/16/16   Comer Locket, PA-C  buPROPion (WELLBUTRIN XL) 300 MG 24 hr tablet Take 1 tablet (300 mg total) by mouth daily. 02/12/16   Glean Hess, MD  Cholecalciferol (VITAMIN D) 2000 UNITS CAPS Take 1 capsule (2,000 Units total) by mouth daily. 11/29/14   Adline Potter, MD  cyclobenzaprine (FLEXERIL) 10 MG tablet Take 1 tablet (10 mg total) by mouth at bedtime as needed for muscle spasms. 07/17/15   Adline Potter, MD  metoprolol succinate (TOPROL-XL) 100 MG 24 hr tablet Take 1 tablet (100 mg total) by mouth daily. Take with or immediately following a meal. 10/17/15   Adline Potter, MD  Multiple Vitamins-Minerals (ONE-A-DAY WOMENS 50 PLUS PO) Take 1 tablet by mouth daily at 12 noon.    Historical Provider, MD  naproxen (NAPROSYN) 500 MG tablet TAKE 1 TABLET (500 MG TOTAL) BY MOUTH 2 (TWO) TIMES DAILY WITH A MEAL. 12/02/15   Glean Hess, MD  naratriptan (AMERGE) 2.5 MG tablet Take 1 tablet (2.5 mg total) by mouth as needed for migraine. Take one (1) tablet at onset of headache; may repeat in 4 hours if needed 03/11/16   Glean Hess, MD  neomycin-polymyxin  b-dexamethasone (MAXITROL) 3.5-10000-0.1 SUSP Place 2 drops into both eyes every 6 (six) hours. 06/14/16   Glean Hess, MD  NUVARING 0.12-0.015 MG/24HR vaginal ring Take 1 tablet by mouth daily. 07/28/15   Historical Provider, MD  predniSONE (DELTASONE) 10 MG tablet Take 3 tabs on days 1-2, take 2 tabs on days 3-4, take 1 tab on days 5-6 07/09/16   Jearld Fenton, NP  triamterene-hydrochlorothiazide (MAXZIDE-25) 37.5-25 MG tablet TAKE 1 TABLET BY MOUTH DAILY. 05/31/16   Glean Hess, MD  Vitamin D, Ergocalciferol,  (DRISDOL) 50000 units CAPS capsule Take 1 capsule (50,000 Units total) by mouth every 7 (seven) days. 07/11/16   Jearld Fenton, NP  zolmitriptan (ZOMIG) 5 MG tablet Take 1 tablet (5 mg total) by mouth once as needed. May repeat in two hours as needed 03/11/16   Glean Hess, MD   Family History Family History  Problem Relation Age of Onset  . Lung disease Mother   . Hypertension Mother   . Kidney disease Mother   . Diabetes Mother   . Hypertension Maternal Grandmother   . Cancer Paternal Grandmother    Social History Social History  Substance Use Topics  . Smoking status: Never Smoker  . Smokeless tobacco: Never Used  . Alcohol use 0.0 oz/week     Comment: occasional   Allergies   Patient has no known allergies.  Review of Systems Review of Systems A complete review of systems was obtained and all systems are negative except as noted in the HPI and PMH.   Physical Exam Updated Vital Signs BP (!) 146/72 (BP Location: Right Arm)   Pulse 80   Temp 98.5 F (36.9 C)   Resp 18   LMP 07/09/2016   SpO2 100%   Physical Exam  Constitutional: She appears well-developed and well-nourished. No distress.  HENT:  Head: Normocephalic and atraumatic.  Mouth/Throat: Oropharynx is clear and moist. No oropharyngeal exudate.  Sounds hoarse  Eyes: Conjunctivae and EOM are normal.  Neck: Normal range of motion. Neck supple. No JVD present.  Cardiovascular: Normal rate, regular rhythm and normal heart sounds.   No murmur heard. Pulmonary/Chest: Effort normal and breath sounds normal. No stridor. No respiratory distress. She has no wheezes. She has no rales.  Abdominal: Soft. She exhibits no distension. There is no tenderness.  Musculoskeletal: Normal range of motion. She exhibits no edema.  Lymphadenopathy:    She has no cervical adenopathy.  Neurological: She is alert.  Skin: No pallor.  Psychiatric: She has a normal mood and affect. Her behavior is normal.  Nursing note and  vitals reviewed.  ED Treatments / Results  DIAGNOSTIC STUDIES: Oxygen Saturation is 100% on RA, normal by my interpretation.   COORDINATION OF CARE: 12:58 PM-Discussed next steps with pt. Pt verbalized understanding and is agreeable with the plan.   Labs (all labs ordered are listed, but only abnormal results are displayed) Labs Reviewed  RAPID STREP SCREEN (NOT AT [facility])  CULTURE, GROUP A STREP [facility])   EKG  EKG Interpretation None      Radiology Dg Chest 2 View  Result Date: 07/16/2016 CLINICAL DATA:  Cough EXAM: CHEST  2 VIEW COMPARISON:  None. FINDINGS: Normal heart size. Normal mediastinal contour. No pneumothorax. No pleural effusion. Lungs appear clear, with no acute consolidative airspace disease and no pulmonary edema. Cholecystectomy clips are seen in the right upper quadrant of the abdomen. IMPRESSION: No active cardiopulmonary disease. Electronically Signed   By: Janina Mayo.D.  On: 07/16/2016 11:45   Procedures Procedures   Medications Ordered in ED Medications - No data to display  Initial Impression / Assessment and Plan / ED Course  I have reviewed the triage vital signs and the nursing notes.  Pertinent labs & imaging results that were available during my care of the patient were reviewed by me and considered in my medical decision making (see chart for details).     Pt CXR negative for acute infiltrate. Strep screen negative, will send for culture. Patients symptoms are consistent with URI, likely viral etiology. Discussed that antibiotics are not indicated for viral infections. Pt will be discharged with symptomatic treatment. Rx Tessalon [place] for cough. Verbalizes understanding and is agreeable with plan. Pt is hemodynamically stable & in NAD prior to dc.  Final Clinical Impressions(s) / ED Diagnoses   Final diagnoses:  Viral URI with cough   New Prescriptions Discharge Medication List as of 07/16/2016  1:17 PM    START taking these  medications   Details  benzonatate (TESSALON) 100 MG capsule Take 1 capsule (100 mg total) by mouth every 8 (eight) hours., Starting Tue 07/16/2016, Print       I personally performed the services described in this documentation, which was scribed in my presence. The recorded information has been reviewed and is accurate.     Comer Locket, PA-C 07/16/16 1337    Merrily Pew, MD 07/16/16 [phone]

## 2016-07-16 NOTE — Telephone Encounter (Signed)
PLEASE NOTE: All timestamps contained within this report are represented as [place] Standard Time. CONFIDENTIALTY NOTICE: This fax transmission is intended only for the addressee. It contains information that is legally privileged, confidential or otherwise protected from use or disclosure. If you are not the intended recipient, you are strictly prohibited from reviewing, disclosing, copying using or disseminating any of this information or taking any action in reliance on or regarding this information. If you have received this fax in error, please notify us immediately by telephone so that we can arrange for its return to [place]. Phone: [phone], Toll-Free: [phone], Fax: [phone] Page: 1 of 1 Call Id: [id number] [name] Patient Name: Mallory Meyers Gender: Female DOB: December 25, 1970 Age: 46 Y 106 M 2 D Return Phone Number: [phone] (Primary) City/State/Zip: [place] Client [facility] - Client Client Site [facility] - Day Physician Webb Silversmith - NP Who Is Calling Patient / Member / Family / Caregiver Call Type Triage / Clinical Relationship To Patient Self Return Phone Number [phone] (Primary) Chief Complaint BREATHING - shortness of breath or sounds breathless Reason for Call Symptomatic / Request for [place] states has been taking steroids for cough and sore throat, she is feeling short of breath Appointment Disposition EMR Appointment Not Necessary Info pasted into Epic No Nurse Assessment Nurse: Angeline Slim, RN, [place] Date/Time (Eastern Time): 07/16/2016 9:37:29 AM Confirm and document reason for call. If symptomatic, describe symptoms. ---caller states she feels short of breath, has had laryngitis since Saturday Does the PT have any chronic conditions? (i.e. diabetes, asthma, etc.) ---No Is the  patient pregnant or possibly pregnant? (Ask all females between the ages of 48-55) ---No Guidelines Guideline Title Affirmed Question Hoarseness Difficulty breathing Disp. Time Eilene Ghazi Time) Disposition Final User 07/16/2016 9:40:00 AM Go to ED Now (or PCP triage) Yes Zellers, RN, [facility] - ED Care Advice Given Per Guideline GO TO ED NOW (OR PCP TRIAGE): DRIVING: Another adult should drive.

## 2016-07-18 LAB — CULTURE, GROUP A STREP (THRC)

## 2016-08-01 ENCOUNTER — Telehealth: Payer: Self-pay | Admitting: Obstetrics & Gynecology

## 2016-08-01 ENCOUNTER — Other Ambulatory Visit: Payer: Self-pay

## 2016-08-01 DIAGNOSIS — Z3044 Encounter for surveillance of vaginal ring hormonal contraceptive device: Secondary | ICD-10-CM

## 2016-08-01 MED ORDER — NUVARING 0.12-0.015 MG/24HR VA RING: 1.0000 | VAGINAL_RING | Freq: Every day | VAGINAL | 0 refills | Status: DC

## 2016-08-01 NOTE — Telephone Encounter (Signed)
Pt is calling to schedule annual with Dr. Kenton Kingfisher and needs an refill on Nura Ring. Pt uses [facility] [place]. Pt is schedule 09/02/16. TY#[id number]

## 2016-08-01 NOTE — Telephone Encounter (Signed)
Refill sent to pharmacy.   

## 2016-08-09 ENCOUNTER — Other Ambulatory Visit: Payer: Self-pay | Admitting: Internal Medicine

## 2016-08-21 ENCOUNTER — Other Ambulatory Visit: Payer: Self-pay | Admitting: Internal Medicine

## 2016-08-21 NOTE — Telephone Encounter (Signed)
Rx never filled by you, new pt--- please advise

## 2016-08-23 ENCOUNTER — Telehealth: Payer: Self-pay | Admitting: Internal Medicine

## 2016-08-23 NOTE — Telephone Encounter (Signed)
I spoke with pt and she started with swelling and pain above her knees to mid thigh; worsened on 08/22/16. No redness or warmth. No SOB or CP. Pt started taking fluid pill but does not take regularly.  I spoke with Avie Echevaria NP and she advised pt to go to UC. pt voiced understanding and will go to UC. FYI to Avie Echevaria NP.

## 2016-08-23 NOTE — Telephone Encounter (Signed)
Patient Name: Mallory Meyers  DOB: 1970/10/18    Initial Comment Caller states she has fluid build up above her knees and they are sore.   Nurse Assessment  Nurse: Cherie Dark RN, [facility] Soho Date/Time (Eastern Time): 08/23/2016 12:56:30 PM  Confirm and document reason for call. If symptomatic, describe symptoms. ---Caller states she is having some fluid buildup on her legs right above her knees and somewhat into her thigh area. It started on Tuesday and got worse. They are both painful and one is more swollen than the other. Took some Naproxen about an hour ago. No redness.  Does the patient have any new or worsening symptoms? ---Yes  Will a triage be completed? ---Yes  Related visit to physician within the last 2 weeks? ---No  Does the PT have any chronic conditions? (i.e. diabetes, asthma, etc.) ---No  Is the patient pregnant or possibly pregnant? (Ask all females between the ages of 75-55) ---No  Is this a behavioral health or substance abuse call? ---No     Guidelines    Guideline Title Affirmed Question Affirmed Notes  Leg Swelling and Edema [1] Can't walk or can barely walk AND [2] new onset    Final Disposition User   Go to ED Now Cherie Dark, RN, Jarrett Soho    Comments  called the backline to let the office know what happened.   Referrals  GO TO FACILITY REFUSED   Disagree/Comply: Disagree  Disagree/Comply Reason: Disagree with instructions

## 2016-08-28 ENCOUNTER — Other Ambulatory Visit: Payer: Self-pay | Admitting: Obstetrics & Gynecology

## 2016-08-28 DIAGNOSIS — Z1231 Encounter for screening mammogram for malignant neoplasm of breast: Secondary | ICD-10-CM

## 2016-08-30 ENCOUNTER — Encounter: Payer: Self-pay | Admitting: Obstetrics & Gynecology

## 2016-08-30 ENCOUNTER — Ambulatory Visit (INDEPENDENT_AMBULATORY_CARE_PROVIDER_SITE_OTHER): Payer: 59 | Admitting: Obstetrics & Gynecology

## 2016-08-30 VITALS — BP 124/74 | Ht 60.0 in | Wt 201.0 lb

## 2016-08-30 DIAGNOSIS — Z1211 Encounter for screening for malignant neoplasm of colon: Secondary | ICD-10-CM

## 2016-08-30 DIAGNOSIS — Z0000 Encounter for general adult medical examination without abnormal findings: Secondary | ICD-10-CM

## 2016-08-30 DIAGNOSIS — Z6839 Body mass index (BMI) 39.0-39.9, adult: Secondary | ICD-10-CM | POA: Diagnosis not present

## 2016-08-30 DIAGNOSIS — Z124 Encounter for screening for malignant neoplasm of cervix: Secondary | ICD-10-CM | POA: Diagnosis not present

## 2016-08-30 DIAGNOSIS — Z3044 Encounter for surveillance of vaginal ring hormonal contraceptive device: Secondary | ICD-10-CM

## 2016-08-30 MED ORDER — PHENTERMINE HCL 37.5 MG PO TABS: 37.5000 mg | ORAL_TABLET | Freq: Every day | ORAL | 0 refills | Status: DC

## 2016-08-30 MED ORDER — NUVARING 0.12-0.015 MG/24HR VA RING: 1.0000 | VAGINAL_RING | Freq: Every day | VAGINAL | 4 refills | Status: DC

## 2016-08-30 NOTE — Progress Notes (Signed)
HPI:      [name] is a 46 y.o. G0P0000 who LMP was Patient's last menstrual period was 07/31/2016., she presents today for her annual examination. The patient has no complaints today. The patient is sexually active. Her last pap: was normal. The patient does perform self breast exams.  There is no notable family history of breast or ovarian cancer in her family.  The patient has regular exercise: yes.  The patient denies current symptoms of depression.    GYN History: Contraception: NuvaRing vaginal inserts  PMHx: Past Medical History:  Diagnosis Date  . Anemia   . Depression   . Headache   . Hypertension   . Polycystic ovaries   . Seizures ([place])    AS A CHILD   Past Surgical History:  Procedure Laterality Date  . CHOLECYSTECTOMY    . KNEE SURGERY Left   . TOE ARTHROPLASTY Right 12/23/2014   Procedure: TOE ARTHROPLASTY;  Surgeon: Sharlotte Alamo, MD;  Location: [facility] ORS;  Service: Podiatry;  Laterality: Right;   Family History  Problem Relation Age of Onset  . Lung disease Mother   . Hypertension Mother   . Kidney disease Mother   . Diabetes Mother   . Hypertension Maternal Grandmother   . Cancer Paternal Grandmother    Social History  Substance Use Topics  . Smoking status: Never Smoker  . Smokeless tobacco: Never Used  . Alcohol use 0.0 oz/week     Comment: occasional    Current Outpatient Prescriptions:  .  benzonatate (TESSALON) 100 MG capsule, Take 1 capsule (100 mg total) by mouth every 8 (eight) hours., Disp: 21 capsule, Rfl: 0 .  buPROPion (WELLBUTRIN XL) 300 MG 24 hr tablet, Take 1 tablet (300 mg total) by mouth daily., Disp: 90 tablet, Rfl: 3 .  Cholecalciferol (VITAMIN D) 2000 UNITS CAPS, Take 1 capsule (2,000 Units total) by mouth daily., Disp: 30 capsule, Rfl:  .  cyclobenzaprine (FLEXERIL) 10 MG tablet, Take 1 tablet (10 mg total) by mouth at bedtime as needed for muscle spasms., Disp: 30 tablet, Rfl: 0 .  metoprolol succinate (TOPROL-XL) 100 MG 24  hr tablet, Take 1 tablet (100 mg total) by mouth daily. Take with or immediately following a meal., Disp: 90 tablet, Rfl: 3 .  Multiple Vitamins-Minerals (ONE-A-DAY WOMENS 50 PLUS PO), Take 1 tablet by mouth daily at 12 noon., Disp: , Rfl:  .  naproxen (NAPROSYN) 500 MG tablet, TAKE 1 TABLET (500 MG TOTAL) BY MOUTH 2 (TWO) TIMES DAILY WITH A MEAL., Disp: 30 tablet, Rfl: 0 .  naratriptan (AMERGE) 2.5 MG tablet, Take 1 tablet (2.5 mg total) by mouth as needed for migraine. Take one (1) tablet at onset of headache; may repeat in 4 hours if needed, Disp: 10 tablet, Rfl: 12 .  neomycin-polymyxin b-dexamethasone (MAXITROL) 3.5-10000-0.1 SUSP, Place 2 drops into both eyes every 6 (six) hours., Disp: 5 mL, Rfl: 0 .  NUVARING 0.12-0.015 MG/24HR vaginal ring, Place 1 each vaginally daily. Insert 1 vaginal ring by vaginal route every 21 days for 3 rounds, then leave out for 7 days., Disp: 3 each, Rfl: 4 .  phentermine (ADIPEX-P) 37.5 MG tablet, Take 1 tablet (37.5 mg total) by mouth daily before breakfast., Disp: 30 tablet, Rfl: 0 .  predniSONE (DELTASONE) 10 MG tablet, Take 3 tabs on days 1-2, take 2 tabs on days 3-4, take 1 tab on days 5-6, Disp: 12 tablet, Rfl: 0 .  triamterene-hydrochlorothiazide (MAXZIDE-25) 37.5-25 MG tablet, TAKE 1 TABLET  BY MOUTH DAILY., Disp: 90 tablet, Rfl: 1 .  Vitamin D, Ergocalciferol, (DRISDOL) 50000 units CAPS capsule, Take 1 capsule (50,000 Units total) by mouth every 7 (seven) days., Disp: 12 capsule, Rfl: 0 .  zolmitriptan (ZOMIG) 5 MG tablet, Take 1 tablet (5 mg total) by mouth once as needed. May repeat in two hours as needed, Disp: 12 tablet, Rfl: 12 Allergies: Patient has no known allergies.  ROS  Objective: BP 124/74   Ht 5' (1.524 m)   Wt 201 lb (91.2 kg)   LMP 07/31/2016   BMI 39.26 kg/m   Filed Weights   08/30/16 0849  Weight: 201 lb (91.2 kg)   Body mass index is 39.26 kg/m. OBGyn Exam  Assessment:  ANNUAL EXAM 1. Annual physical exam   2. Screening for  cervical cancer   3. Encounter for surveillance of vaginal ring hormonal contraceptive device   4. BMI 39.0-39.9,adult   5. Screen for colon cancer      Screening Plan:            1.  Cervical Screening-  Pap smear done today  2. Breast screening- Exam annually and mammogram>40 planned   3. Colonoscopy (NEEDS SOON) every 10 years, Hemoccult testing - after age 23  4. Labs managed by PCP  5. Counseling for contraception: NuvaRing vaginal inserts  Other:  1. Annual physical exam  2. Screening for cervical cancer - IGP, Aptima HPV  3. Encounter for surveillance of vaginal ring hormonal contraceptive device - NUVARING 0.12-0.015 MG/24HR vaginal ring; Place 1 each vaginally daily. Insert 1 vaginal ring by vaginal route every 21 days for 3 rounds, then leave out for 7 days.  Dispense: 3 each; Refill: 4  4. BMI 39.0-39.9,adult - phentermine (ADIPEX-P) 37.5 MG tablet; Take 1 tablet (37.5 mg total) by mouth daily before breakfast.  Dispense: 30 tablet; Refill: 0 Counseled to assist patient in incorporating positive experiences into her life to promote a positive mental attitude.  Education given regarding appropriate lifestyle changes for weight loss, including regular physical activity, healthy coping strategies, caloric restriction, and healthy eating patterns. The risks and benefits as well as side effects of medication, such as Phenteramine or Tenuate, is discussed.  The pros and cons of suppressing appetite and boosting metabolism is counseled.  Risks of tolerance and addiction discussed.  Use of medicine will be short term.  Pt to call with any negative side effects and agrees to keep follow up appointments.    F/U  Return in about 4 weeks (around 09/27/2016) for Follow up.  Barnett Applebaum, MD, [facility], [facility] 08/30/2016  9:16 AM

## 2016-08-30 NOTE — Patient Instructions (Signed)

## 2016-09-03 ENCOUNTER — Ambulatory Visit: Payer: 59 | Admitting: General Surgery

## 2016-09-03 ENCOUNTER — Inpatient Hospital Stay: Admission: RE | Admit: 2016-09-03 | Payer: 59 | Source: Ambulatory Visit

## 2016-09-04 LAB — IGP, APTIMA HPV
HPV Aptima: NEGATIVE
PAP SMEAR COMMENT: 0

## 2016-09-05 ENCOUNTER — Ambulatory Visit (INDEPENDENT_AMBULATORY_CARE_PROVIDER_SITE_OTHER): Payer: 59 | Admitting: General Surgery

## 2016-09-05 ENCOUNTER — Encounter: Payer: Self-pay | Admitting: General Surgery

## 2016-09-05 ENCOUNTER — Ambulatory Visit
Admission: RE | Admit: 2016-09-05 | Discharge: 2016-09-05 | Disposition: A | Discharge status: Home / Self Care | Payer: 59 | Source: Ambulatory Visit | Attending: Obstetrics & Gynecology | Admitting: Obstetrics & Gynecology

## 2016-09-05 VITALS — BP 130/80 | HR 100 | Resp 14 | Ht 60.0 in | Wt 200.0 lb

## 2016-09-05 DIAGNOSIS — Z1231 Encounter for screening mammogram for malignant neoplasm of breast: Secondary | ICD-10-CM | POA: Insufficient documentation

## 2016-09-05 DIAGNOSIS — Z8371 Family history of colonic polyps: Secondary | ICD-10-CM

## 2016-09-05 MED ORDER — POLYETHYLENE GLYCOL 3350 17 GM/SCOOP PO POWD: ORAL | 0 refills | Status: DC

## 2016-09-05 NOTE — Patient Instructions (Signed)
Colonoscopy, Adult A colonoscopy is an exam to look at the entire large intestine. During the exam, a lubricated, bendable tube is inserted into the anus and then passed into the rectum, colon, and other parts of the large intestine. A colonoscopy is often done as a part of normal colorectal screening or in response to certain symptoms, such as anemia, persistent diarrhea, abdominal pain, and blood in the stool. The exam can help screen for and diagnose medical problems, including:  Tumors.  Polyps.  Inflammation.  Areas of bleeding. Tell a health care provider about:  Any allergies you have.  All medicines you are taking, including vitamins, herbs, eye drops, creams, and over-the-counter medicines.  Any problems you or family members have had with anesthetic medicines.  Any blood disorders you have.  Any surgeries you have had.  Any medical conditions you have.  Any problems you have had passing stool. What are the risks? Generally, this is a safe procedure. However, problems may occur, including:  Bleeding.  A tear in the intestine.  A reaction to medicines given during the exam.  Infection (rare). What happens before the procedure? Eating and drinking restrictions  Follow instructions from your health care provider about eating and drinking, which may include:  A few days before the procedure - follow a low-fiber diet. Avoid nuts, seeds, dried fruit, raw fruits, and vegetables.  1-3 days before the procedure - follow a clear liquid diet. Drink only clear liquids, such as clear broth or bouillon, black coffee or tea, clear juice, clear soft drinks or sports drinks, gelatin dessert, and popsicles. Avoid any liquids that contain red or purple dye.  On the day of the procedure - do not eat or drink anything during the 2 hours before the procedure, or within the time period that your health care provider recommends. Bowel prep  If you were prescribed an oral bowel prep to  clean out your colon:  Take it as told by your health care provider. Starting the day before your procedure, you will need to drink a large amount of medicated liquid. The liquid will cause you to have multiple loose stools until your stool is almost clear or light green.  If your skin or anus gets irritated from diarrhea, you may use these to relieve the irritation:  Medicated wipes, such as adult wet wipes with aloe and vitamin E.  A skin soothing-product like petroleum jelly.  If you vomit while drinking the bowel prep, take a break for up to 60 minutes and then begin the bowel prep again. If vomiting continues and you cannot take the bowel prep without vomiting, call your health care provider. General instructions   Ask your health care provider about changing or stopping your regular medicines. This is especially important if you are taking diabetes medicines or blood thinners.  Plan to have someone take you home from the hospital or clinic. What happens during the procedure?  An IV tube may be inserted into one of your veins.  You will be given medicine to help you relax (sedative).  To reduce your risk of infection:  Your health care team will wash or sanitize their hands.  Your anal area will be washed with soap.  You will be asked to lie on your side with your knees bent.  Your health care provider will lubricate a long, thin, flexible tube. The tube will have a camera and a light on the end.  The tube will be inserted into your anus.    The tube will be gently eased through your rectum and colon.  Air will be delivered into your colon to keep it open. You may feel some pressure or cramping.  The camera will be used to take images during the procedure.  A small tissue sample may be removed from your body to be examined under a microscope (biopsy). If any potential problems are found, the tissue will be sent to a lab for testing.  If small polyps are found, your health  care provider may remove them and have them checked for cancer cells.  The tube that was inserted into your anus will be slowly removed. The procedure may vary among health care providers and hospitals. What happens after the procedure?  Your blood pressure, heart rate, breathing rate, and blood oxygen level will be monitored until the medicines you were given have worn off.  Do not drive for 24 hours after the exam.  You may have a small amount of blood in your stool.  You may pass gas and have mild abdominal cramping or bloating due to the air that was used to inflate your colon during the exam.  It is up to you to get the results of your procedure. Ask your health care provider, or the department performing the procedure, when your results will be ready. This information is not intended to replace advice given to you by your health care provider. Make sure you discuss any questions you have with your health care provider. Document Released: 04/12/2000 Document Revised: 02/14/2016 Document Reviewed: 06/27/2015 Elsevier Interactive Patient Education  2017 Elsevier Inc.  

## 2016-09-05 NOTE — Progress Notes (Addendum)
Patient ID: Mallory Meyers, female   DOB: 06-Oct-1970, 46 y.o.   MRN: [id number]  Chief Complaint  Patient presents with  . Colonoscopy    HPI Mallory Meyers is a 46 y.o. female.  Who presents for a colonoscopy discussion, none prior. She was referred by Dr Kenton Kingfisher. Mother had history of colon polyps with colon resection, but no further details, done in [place]. Denies any gastrointestinal issues. Bowels move regular and no bleeding noted.  HPI  Past Medical History:  Diagnosis Date  . Anemia   . Depression   . Headache   . Hypertension   . Polycystic ovaries   . Seizures ([place])    AS A CHILD    Past Surgical History:  Procedure Laterality Date  . CHOLECYSTECTOMY  1997  . KNEE SURGERY Left   . TOE ARTHROPLASTY Right 12/23/2014   Procedure: TOE ARTHROPLASTY;  Surgeon: Sharlotte Alamo, MD;  Location: [facility] ORS;  Service: Podiatry;  Laterality: Right;    Family History  Problem Relation Age of Onset  . Lung disease Mother   . Hypertension Mother   . Kidney disease Mother   . Diabetes Mother   . Colon polyps Mother        colon resection  . Hypertension Maternal Grandmother   . Breast cancer Neg Hx   . Colon cancer Neg Hx     Social History Social History  Substance Use Topics  . Smoking status: Never Smoker  . Smokeless tobacco: Never Used  . Alcohol use 0.0 oz/week     Comment: occasional    No Known Allergies  Current Outpatient Prescriptions  Medication Sig Dispense Refill  . buPROPion (WELLBUTRIN XL) 300 MG 24 hr tablet Take 1 tablet (300 mg total) by mouth daily. 90 tablet 3  . cyclobenzaprine (FLEXERIL) 10 MG tablet Take 1 tablet (10 mg total) by mouth at bedtime as needed for muscle spasms. 30 tablet 0  . metoprolol succinate (TOPROL-XL) 100 MG 24 hr tablet Take 1 tablet (100 mg total) by mouth daily. Take with or immediately following a meal. 90 tablet 3  . Multiple Vitamins-Minerals (ONE-A-DAY WOMENS 50 PLUS PO) Take 1 tablet by mouth daily at 12 noon.     . naproxen (NAPROSYN) 500 MG tablet TAKE 1 TABLET (500 MG TOTAL) BY MOUTH 2 (TWO) TIMES DAILY WITH A MEAL. 30 tablet 0  . naratriptan (AMERGE) 2.5 MG tablet Take 1 tablet (2.5 mg total) by mouth as needed for migraine. Take one (1) tablet at onset of headache; may repeat in 4 hours if needed 10 tablet 12  . NUVARING 0.12-0.015 MG/24HR vaginal ring Place 1 each vaginally daily. Insert 1 vaginal ring by vaginal route every 21 days for 3 rounds, then leave out for 7 days. 3 each 4  . phentermine (ADIPEX-P) 37.5 MG tablet Take 1 tablet (37.5 mg total) by mouth daily before breakfast. 30 tablet 0  . triamterene-hydrochlorothiazide (MAXZIDE-25) 37.5-25 MG tablet TAKE 1 TABLET BY MOUTH DAILY. 90 tablet 1  . Vitamin D, Ergocalciferol, (DRISDOL) 50000 units CAPS capsule Take 1 capsule (50,000 Units total) by mouth every 7 (seven) days. 12 capsule 0  . zolmitriptan (ZOMIG) 5 MG tablet Take 1 tablet (5 mg total) by mouth once as needed. May repeat in two hours as needed 12 tablet 12  . polyethylene glycol powder (GLYCOLAX/MIRALAX) powder 255 grams one bottle for colonoscopy prep 255 g 0   No current facility-administered medications for this visit.     Review of  Systems Review of Systems  Constitutional: Negative.   Respiratory: Negative.   Cardiovascular: Negative.   Gastrointestinal: Negative for anal bleeding, blood in stool, constipation and diarrhea.    Blood pressure 130/80, pulse 100, resp. rate 14, height 5' (1.524 m), weight 200 lb (90.7 kg), last menstrual period 07/31/2016.  Physical Exam Physical Exam  Constitutional: She is oriented to person, place, and time. She appears well-developed and well-nourished.  HENT:  Mouth/Throat: Oropharynx is clear and moist.  Eyes: Conjunctivae are normal. No scleral icterus.  Neck: Neck supple.  Cardiovascular: Normal rate, regular rhythm and normal heart sounds.   Pulmonary/Chest: Effort normal and breath sounds normal.  Abdominal: Soft. Normal  appearance and bowel sounds are normal. There is no hepatomegaly. There is no tenderness.  Lymphadenopathy:    She has no cervical adenopathy.  Neurological: She is alert and oriented to person, place, and time.  Skin: Skin is warm and dry.  Psychiatric: Her behavior is normal.    Data Reviewed Progress notes  Assessment    Stable physical exam. Family history of colon polyps.    Plan   Given FH feel it is reasonable to proceed with a colonoscopy even though she is only 87. Pt is agreeable.  Colonoscopy with possible biopsy/polypectomy prn: Information regarding the procedure, including its potential risks and complications (including but not limited to perforation of the bowel, which may require emergency surgery to repair, and bleeding) was verbally given to the patient. Educational information regarding lower intestinal endoscopy was given to the patient. Written instructions for how to complete the bowel prep using Miralax were provided. The importance of drinking ample fluids to avoid dehydration as a result of the prep emphasized.      HPI, Physical Exam, Assessment and Plan have been scribed under the direction and in the presence of Mckinley Jewel, MD  Karie Fetch, RN I have completed the exam and reviewed the above documentation for accuracy and completeness.  I agree with the above.  Haematologist has been used and any errors in dictation or transcription are unintentional.  Janiyha Montufar G. Jamal Collin, M.D., F.A.C.S.  Junie Panning G 09/10/2016, 10:02 AM  Patient has been scheduled for a colonoscopy on 09-25-16 at [facility]. Miralax prescription has been sent in to the patient's pharmacy today. Colonoscopy instructions have been reviewed with the patient. This patient is aware to call the office if they have further questions.   Dominga Ferry, CMA

## 2016-09-18 ENCOUNTER — Other Ambulatory Visit: Payer: Self-pay | Admitting: Internal Medicine

## 2016-09-25 ENCOUNTER — Encounter
Admission: RE | Disposition: A | Discharge status: Home / Self Care | Payer: Self-pay | Source: Ambulatory Visit | Attending: General Surgery

## 2016-09-25 ENCOUNTER — Ambulatory Visit: Payer: 59 | Admitting: Anesthesiology

## 2016-09-25 ENCOUNTER — Ambulatory Visit
Admission: RE | Admit: 2016-09-25 | Discharge: 2016-09-25 | Disposition: A | Discharge status: Home / Self Care | Payer: 59 | Source: Ambulatory Visit | Attending: General Surgery | Admitting: General Surgery

## 2016-09-25 DIAGNOSIS — F329 Major depressive disorder, single episode, unspecified: Secondary | ICD-10-CM | POA: Diagnosis not present

## 2016-09-25 DIAGNOSIS — Z8371 Family history of colonic polyps: Secondary | ICD-10-CM | POA: Diagnosis not present

## 2016-09-25 DIAGNOSIS — Z79899 Other long term (current) drug therapy: Secondary | ICD-10-CM | POA: Insufficient documentation

## 2016-09-25 DIAGNOSIS — Z1211 Encounter for screening for malignant neoplasm of colon: Secondary | ICD-10-CM | POA: Insufficient documentation

## 2016-09-25 DIAGNOSIS — I10 Essential (primary) hypertension: Secondary | ICD-10-CM | POA: Insufficient documentation

## 2016-09-25 DIAGNOSIS — Z8249 Family history of ischemic heart disease and other diseases of the circulatory system: Secondary | ICD-10-CM | POA: Insufficient documentation

## 2016-09-25 DIAGNOSIS — E282 Polycystic ovarian syndrome: Secondary | ICD-10-CM | POA: Diagnosis not present

## 2016-09-25 DIAGNOSIS — M62838 Other muscle spasm: Secondary | ICD-10-CM | POA: Diagnosis not present

## 2016-09-25 DIAGNOSIS — Z833 Family history of diabetes mellitus: Secondary | ICD-10-CM | POA: Insufficient documentation

## 2016-09-25 HISTORY — PX: COLONOSCOPY WITH PROPOFOL: SHX5780

## 2016-09-25 SURGERY — COLONOSCOPY WITH PROPOFOL
Anesthesia: General

## 2016-09-25 MED ORDER — MIDAZOLAM HCL 2 MG/2ML IJ SOLN
INTRAMUSCULAR | Status: DC | PRN
  Administered 2016-09-25: 2 mg via INTRAVENOUS

## 2016-09-25 MED ORDER — SODIUM CHLORIDE 0.9 % IV SOLN
INTRAVENOUS | Status: DC
  Administered 2016-09-25: 1000 mL via INTRAVENOUS

## 2016-09-25 MED ORDER — PROPOFOL 500 MG/50ML IV EMUL
INTRAVENOUS | Status: DC | PRN
  Administered 2016-09-25: 150 ug/kg/min via INTRAVENOUS

## 2016-09-25 MED ORDER — PROPOFOL 10 MG/ML IV BOLUS
INTRAVENOUS | Status: DC | PRN
  Administered 2016-09-25: 60 mg via INTRAVENOUS

## 2016-09-25 MED ORDER — MIDAZOLAM HCL 2 MG/2ML IJ SOLN
INTRAMUSCULAR | Status: AC
  Filled 2016-09-25: qty 2

## 2016-09-25 MED ORDER — PROPOFOL 500 MG/50ML IV EMUL
INTRAVENOUS | Status: AC
  Filled 2016-09-25: qty 50

## 2016-09-25 NOTE — Anesthesia Post-op Follow-up Note (Cosign Needed)
Anesthesia QCDR form completed.        

## 2016-09-25 NOTE — Anesthesia Procedure Notes (Signed)
Performed by: Amelya Mabry Pre-anesthesia Checklist: Patient identified, Emergency Drugs available, Suction available, Patient being monitored and Timeout performed Patient Re-evaluated:Patient Re-evaluated prior to inductionOxygen Delivery Method: Nasal cannula Preoxygenation: Pre-oxygenation with 100% oxygen Intubation Type: IV induction       

## 2016-09-25 NOTE — Anesthesia Preprocedure Evaluation (Signed)
Anesthesia Evaluation  Patient identified by MRN, date of birth, ID band Patient awake    Reviewed: Allergy & Precautions, H&P , NPO status , Patient's Chart, lab work & pertinent test results, reviewed documented beta blocker date and time   Airway Mallampati: II   Neck ROM: full    Dental  (+) Teeth Intact   Pulmonary neg pulmonary ROS,    Pulmonary exam normal        Cardiovascular hypertension, negative cardio ROS Normal cardiovascular exam Rhythm:regular Rate:Normal     Neuro/Psych  Headaches, Seizures -, Well Controlled,  PSYCHIATRIC DISORDERS negative neurological ROS  negative psych ROS   GI/Hepatic negative GI ROS, Neg liver ROS,   Endo/Other  negative endocrine ROS  Renal/GU negative Renal ROS  negative genitourinary   Musculoskeletal   Abdominal   Peds  Hematology negative hematology ROS (+) anemia ,   Anesthesia Other Findings Past Medical History: No date: Anemia No date: Depression No date: Headache No date: Hypertension No date: Polycystic ovaries No date: Seizures ([place])     Comment: AS A CHILD Past Surgical History: 1997: CHOLECYSTECTOMY No date: KNEE SURGERY Left 12/23/2014: TOE ARTHROPLASTY Right     Comment: Procedure: TOE ARTHROPLASTY;  Surgeon: Sharlotte Alamo, MD;  Location: [facility] ORS;  Service:               Podiatry;  Laterality: Right;   Reproductive/Obstetrics negative OB ROS                             Anesthesia Physical Anesthesia Plan  ASA: II  Anesthesia Plan: General   Post-op Pain Management:    Induction:   Airway Management Planned:   Additional Equipment:   Intra-op Plan:   Post-operative Plan:   Informed Consent: I have reviewed the patients History and Physical, chart, labs and discussed the procedure including the risks, benefits and alternatives for the proposed anesthesia with the patient or authorized representative  who has indicated his/her understanding and acceptance.   Dental Advisory Given  Plan Discussed with: CRNA  Anesthesia Plan Comments:         Anesthesia Quick Evaluation

## 2016-09-25 NOTE — Transfer of Care (Signed)
Immediate Anesthesia Transfer of Care Note  Patient: Mallory Meyers  Procedure(s) Performed: Procedure(s): COLONOSCOPY WITH PROPOFOL (N/A)  Patient Location: PACU  Anesthesia Type:General  Level of Consciousness: sedated and responds to stimulation  Airway & Oxygen Therapy: Patient Spontanous Breathing and Patient connected to nasal cannula oxygen  Post-op Assessment: Report given to RN and Post -op Vital signs reviewed and stable  Post vital signs: Reviewed and stable  Last Vitals:  Vitals:   09/25/16 0857 09/25/16 0957  BP: (!) 126/97 121/69  Pulse: 84 80  Resp: 16 13  Temp: 36.1 C     Last Pain:  Vitals:   09/25/16 0857  TempSrc: Tympanic         Complications: No apparent anesthesia complications

## 2016-09-25 NOTE — Op Note (Signed)
[facility] Patient Name: Mallory Meyers Procedure Date: 09/25/2016 9:30 AM MRN: [id number] Account #: [id number] Date of Birth: 06/07/1970 Admit Type: Outpatient Age: 46 Room: [facility] ENDO ROOM 1 Gender: Female Note Status: Finalized Procedure:            Colonoscopy Indications:          Colon cancer screening in patient at increased risk:                        Family history of 1st-degree relative with colon polyps Providers:            Seeplaputhur G. Jamal Collin, MD Referring MD:         Forest Gleason Md, MD (Referring MD) Medicines:            Total IV Anesthesia (TIVA) Complications:        No immediate complications. Procedure:            Pre-Anesthesia Assessment:                       - Using IV propofol under the supervision of an                        anesthesiologist was determined to be medically                        necessary for this procedure based on review of the                        patient's medical history, medications, and prior                        anesthesia history.                       After obtaining informed consent, the colonoscope was                        passed under direct vision. Throughout the procedure,                        the patient's blood pressure, pulse, and oxygen                        saturations were monitored continuously. The                        Colonoscope was introduced through the anus and                        advanced to the the cecum, identified by the ileocecal                        valve. The colonoscopy was performed without                        difficulty. The patient tolerated the procedure well.                        The quality of the bowel preparation was good. Findings:      The perianal and digital rectal examinations  were normal.      The entire examined colon appeared normal on direct and retroflexion       views. Impression:           - The entire examined colon is normal on  direct and                        retroflexion views.                       - No specimens collected. Recommendation:       - Repeat colonoscopy in 5 years for surveillance.                       - Discharge patient to home.                       - Resume previous diet.                       - Continue present medications. Procedure Code(s):    --- Professional ---                       [phone], Colonoscopy, flexible; diagnostic, including                        collection of specimen(s) by brushing or washing, when                        performed (separate procedure) Diagnosis Code(s):    --- Professional ---                       Z83.71, Family history of colonic polyps CPT copyright 2016 American Medical Association. All rights reserved. The codes documented in this report are preliminary and upon coder review may  be revised to meet current compliance requirements. Christene Lye, MD 09/25/2016 9:57:37 AM This report has been signed electronically. Number of Addenda: 0 Note Initiated On: 09/25/2016 9:30 AM Scope Withdrawal Time: 0 hours 4 minutes 51 seconds  Total Procedure Duration: 0 hours 18 minutes 19 seconds       [facility]

## 2016-09-25 NOTE — Interval H&P Note (Signed)
History and Physical Interval Note:  09/25/2016 9:28 AM  Mallory Meyers  has presented today for surgery, with the diagnosis of FH COLON POLYPS  The various methods of treatment have been discussed with the patient and family. After consideration of risks, benefits and other options for treatment, the patient has consented to  Procedure(s): COLONOSCOPY WITH PROPOFOL (N/A) as a surgical intervention .  The patient's history has been reviewed, patient examined, no change in status, stable for surgery.  I have reviewed the patient's chart and labs.  Questions were answered to the patient's satisfaction.     SANKAR,SEEPLAPUTHUR G

## 2016-09-25 NOTE — H&P (View-Only) (Signed)
Patient ID: Mallory Meyers, female   DOB: 1970-07-16, 46 y.o.   MRN: [id number]  Chief Complaint  Patient presents with  . Colonoscopy    HPI Mallory Meyers is a 46 y.o. female.  Who presents for a colonoscopy discussion, none prior. She was referred by Dr Kenton Kingfisher. Mother had history of colon polyps with colon resection, but no further details, done in [place]. Denies any gastrointestinal issues. Bowels move regular and no bleeding noted.  HPI  Past Medical History:  Diagnosis Date  . Anemia   . Depression   . Headache   . Hypertension   . Polycystic ovaries   . Seizures ([place])    AS A CHILD    Past Surgical History:  Procedure Laterality Date  . CHOLECYSTECTOMY  1997  . KNEE SURGERY Left   . TOE ARTHROPLASTY Right 12/23/2014   Procedure: TOE ARTHROPLASTY;  Surgeon: Sharlotte Alamo, MD;  Location: [facility] ORS;  Service: Podiatry;  Laterality: Right;    Family History  Problem Relation Age of Onset  . Lung disease Mother   . Hypertension Mother   . Kidney disease Mother   . Diabetes Mother   . Colon polyps Mother        colon resection  . Hypertension Maternal Grandmother   . Breast cancer Neg Hx   . Colon cancer Neg Hx     Social History Social History  Substance Use Topics  . Smoking status: Never Smoker  . Smokeless tobacco: Never Used  . Alcohol use 0.0 oz/week     Comment: occasional    No Known Allergies  Current Outpatient Prescriptions  Medication Sig Dispense Refill  . buPROPion (WELLBUTRIN XL) 300 MG 24 hr tablet Take 1 tablet (300 mg total) by mouth daily. 90 tablet 3  . cyclobenzaprine (FLEXERIL) 10 MG tablet Take 1 tablet (10 mg total) by mouth at bedtime as needed for muscle spasms. 30 tablet 0  . metoprolol succinate (TOPROL-XL) 100 MG 24 hr tablet Take 1 tablet (100 mg total) by mouth daily. Take with or immediately following a meal. 90 tablet 3  . Multiple Vitamins-Minerals (ONE-A-DAY WOMENS 50 PLUS PO) Take 1 tablet by mouth daily at 12 noon.     . naproxen (NAPROSYN) 500 MG tablet TAKE 1 TABLET (500 MG TOTAL) BY MOUTH 2 (TWO) TIMES DAILY WITH A MEAL. 30 tablet 0  . naratriptan (AMERGE) 2.5 MG tablet Take 1 tablet (2.5 mg total) by mouth as needed for migraine. Take one (1) tablet at onset of headache; may repeat in 4 hours if needed 10 tablet 12  . NUVARING 0.12-0.015 MG/24HR vaginal ring Place 1 each vaginally daily. Insert 1 vaginal ring by vaginal route every 21 days for 3 rounds, then leave out for 7 days. 3 each 4  . phentermine (ADIPEX-P) 37.5 MG tablet Take 1 tablet (37.5 mg total) by mouth daily before breakfast. 30 tablet 0  . triamterene-hydrochlorothiazide (MAXZIDE-25) 37.5-25 MG tablet TAKE 1 TABLET BY MOUTH DAILY. 90 tablet 1  . Vitamin D, Ergocalciferol, (DRISDOL) 50000 units CAPS capsule Take 1 capsule (50,000 Units total) by mouth every 7 (seven) days. 12 capsule 0  . zolmitriptan (ZOMIG) 5 MG tablet Take 1 tablet (5 mg total) by mouth once as needed. May repeat in two hours as needed 12 tablet 12  . polyethylene glycol powder (GLYCOLAX/MIRALAX) powder 255 grams one bottle for colonoscopy prep 255 g 0   No current facility-administered medications for this visit.     Review of  Systems Review of Systems  Constitutional: Negative.   Respiratory: Negative.   Cardiovascular: Negative.   Gastrointestinal: Negative for anal bleeding, blood in stool, constipation and diarrhea.    Blood pressure 130/80, pulse 100, resp. rate 14, height 5' (1.524 m), weight 200 lb (90.7 kg), last menstrual period 07/31/2016.  Physical Exam Physical Exam  Constitutional: She is oriented to person, place, and time. She appears well-developed and well-nourished.  HENT:  Mouth/Throat: Oropharynx is clear and moist.  Eyes: Conjunctivae are normal. No scleral icterus.  Neck: Neck supple.  Cardiovascular: Normal rate, regular rhythm and normal heart sounds.   Pulmonary/Chest: Effort normal and breath sounds normal.  Abdominal: Soft. Normal  appearance and bowel sounds are normal. There is no hepatomegaly. There is no tenderness.  Lymphadenopathy:    She has no cervical adenopathy.  Neurological: She is alert and oriented to person, place, and time.  Skin: Skin is warm and dry.  Psychiatric: Her behavior is normal.    Data Reviewed Progress notes  Assessment    Stable physical exam. Family history of colon polyps.    Plan   Given FH feel it is reasonable to proceed with a colonoscopy even though she is only 53. Pt is agreeable.  Colonoscopy with possible biopsy/polypectomy prn: Information regarding the procedure, including its potential risks and complications (including but not limited to perforation of the bowel, which may require emergency surgery to repair, and bleeding) was verbally given to the patient. Educational information regarding lower intestinal endoscopy was given to the patient. Written instructions for how to complete the bowel prep using Miralax were provided. The importance of drinking ample fluids to avoid dehydration as a result of the prep emphasized.      HPI, Physical Exam, Assessment and Plan have been scribed under the direction and in the presence of Mckinley Jewel, MD  Karie Fetch, RN I have completed the exam and reviewed the above documentation for accuracy and completeness.  I agree with the above.  Haematologist has been used and any errors in dictation or transcription are unintentional.  Harley Fitzwater G. Jamal Collin, M.D., F.A.C.S.  Junie Panning G 09/10/2016, 10:02 AM  Patient has been scheduled for a colonoscopy on 09-25-16 at [facility]. Miralax prescription has been sent in to the patient's pharmacy today. Colonoscopy instructions have been reviewed with the patient. This patient is aware to call the office if they have further questions.   Dominga Ferry, CMA

## 2016-09-26 ENCOUNTER — Encounter: Payer: Self-pay | Admitting: General Surgery

## 2016-09-27 ENCOUNTER — Ambulatory Visit: Payer: 59 | Admitting: Obstetrics & Gynecology

## 2016-09-30 NOTE — Anesthesia Postprocedure Evaluation (Signed)
Anesthesia Post Note  Patient: Mallory Meyers  Procedure(s) Performed: Procedure(s) (LRB): COLONOSCOPY WITH PROPOFOL (N/A)  Patient location during evaluation: PACU Anesthesia Type: General Level of consciousness: awake and alert Pain management: pain level controlled Vital Signs Assessment: post-procedure vital signs reviewed and stable Respiratory status: spontaneous breathing, nonlabored ventilation, respiratory function stable and patient connected to nasal cannula oxygen Cardiovascular status: blood pressure returned to baseline and stable Postop Assessment: no signs of nausea or vomiting Anesthetic complications: no     Last Vitals:  Vitals:   09/25/16 1008 09/25/16 1028  BP: 93/74 (!) 129/105  Pulse: 84 71  Resp: (!) 25 16  Temp:      Last Pain:  Vitals:   09/26/16 0746  TempSrc:   PainSc: 0-No pain                 Molli Barrows

## 2016-10-17 ENCOUNTER — Ambulatory Visit: Payer: 59 | Admitting: Obstetrics & Gynecology

## 2016-11-12 ENCOUNTER — Ambulatory Visit (INDEPENDENT_AMBULATORY_CARE_PROVIDER_SITE_OTHER): Payer: 59 | Admitting: Internal Medicine

## 2016-11-12 ENCOUNTER — Encounter: Payer: Self-pay | Admitting: Internal Medicine

## 2016-11-12 VITALS — BP 126/84 | HR 81 | Temp 98.3°F | Wt 202.0 lb

## 2016-11-12 DIAGNOSIS — F329 Major depressive disorder, single episode, unspecified: Secondary | ICD-10-CM

## 2016-11-12 DIAGNOSIS — G43C1 Periodic headache syndromes in child or adult, intractable: Secondary | ICD-10-CM

## 2016-11-12 DIAGNOSIS — E559 Vitamin D deficiency, unspecified: Secondary | ICD-10-CM

## 2016-11-12 DIAGNOSIS — F419 Anxiety disorder, unspecified: Secondary | ICD-10-CM

## 2016-11-12 MED ORDER — SERTRALINE HCL 25 MG PO TABS: 25.0000 mg | ORAL_TABLET | Freq: Every day | ORAL | 2 refills | Status: DC

## 2016-11-12 NOTE — Patient Instructions (Signed)
Vitamin D Deficiency Vitamin D deficiency is when your body does not have enough vitamin D. Vitamin D is important because:  It helps your body use other minerals that your body needs.  It helps keep your bones strong and healthy.  It may help to prevent some diseases.  It helps your heart and other muscles work well.  You can get vitamin D by:  Eating foods with vitamin D in them.  Drinking or eating milk or other foods that have had vitamin D added to them.  Taking a vitamin D supplement.  Being in the sun.  Not getting enough vitamin D can make your bones become soft. It can also cause other health problems. Follow these instructions at home:  Take medicines and supplements only as told by your doctor.  Eat foods that have vitamin D. These include: ? Dairy products, cereals, or juices with added vitamin D. Check the label for vitamin D. ? Fatty fish like salmon or trout. ? Eggs. ? Oysters.  Do not use tanning beds.  Stay at a healthy weight. Lose weight, if needed.  Keep all follow-up visits as told by your doctor. This is important. Contact a doctor if:  Your symptoms do not go away.  You feel sick to your stomach (nauseous).  Youthrow up (vomit).  You poop less often than usual or you have trouble pooping (constipation). This information is not intended to replace advice given to you by your health care provider. Make sure you discuss any questions you have with your health care provider. Document Released: 04/04/2011 Document Revised: 09/21/2015 Document Reviewed: 08/31/2014 Elsevier Interactive Patient Education  2018 Elsevier Inc.  

## 2016-11-12 NOTE — Progress Notes (Signed)
Subjective:    Patient ID: Mallory Meyers, female    DOB: 1971/02/18, 45 y.o.   MRN: [id number]  HPI  Pt presents to the clinic today with c/o worsening migraines. She reports she has been having migraines at least once daily for the last 1-2 weeks ago. She is not sure what has been making her migraines worse, but thinks it may be stress related. She also reports they are much more intense around her menstrual cycle. She does have sensitivity to light but not sound. She denies nausea, vomiting or dizziness. She reports she is taking Zomig and Toprol without any relief. She has also tried Advil, Excedrin without any relief.   She also want to discuss her anxiety and depression. She reports she feels more down lately. She does not feel like the Wellbutrin is as effective as it used to be. She has been treated with other medications in he past, but can not recall the names of the medications. She denies SI/HI.  She also wants to recheck her Vit D. She recently finished up 12 weeks of Ergocalciferol.  Review of Systems      Past Medical History:  Diagnosis Date  . Anemia   . Depression   . Headache   . Hypertension   . Polycystic ovaries   . Seizures ([place])    AS A CHILD    Current Outpatient Prescriptions  Medication Sig Dispense Refill  . buPROPion (WELLBUTRIN XL) 300 MG 24 hr tablet Take 1 tablet (300 mg total) by mouth daily. 90 tablet 3  . cyclobenzaprine (FLEXERIL) 10 MG tablet Take 1 tablet (10 mg total) by mouth at bedtime as needed for muscle spasms. 30 tablet 0  . metoprolol succinate (TOPROL-XL) 100 MG 24 hr tablet Take 1 tablet (100 mg total) by mouth daily. Take with or immediately following a meal. 90 tablet 3  . Multiple Vitamins-Minerals (ONE-A-DAY WOMENS 50 PLUS PO) Take 1 tablet by mouth daily at 12 noon.    . naproxen (NAPROSYN) 500 MG tablet TAKE 1 TABLET (500 MG TOTAL) BY MOUTH 2 (TWO) TIMES DAILY WITH A MEAL. 30 tablet 0  . naratriptan (AMERGE) 2.5 MG tablet Take 1  tablet (2.5 mg total) by mouth as needed for migraine. Take one (1) tablet at onset of headache; may repeat in 4 hours if needed 10 tablet 12  . NUVARING 0.12-0.015 MG/24HR vaginal ring Place 1 each vaginally daily. Insert 1 vaginal ring by vaginal route every 21 days for 3 rounds, then leave out for 7 days. 3 each 4  . phentermine (ADIPEX-P) 37.5 MG tablet Take 1 tablet (37.5 mg total) by mouth daily before breakfast. 30 tablet 0  . polyethylene glycol powder (GLYCOLAX/MIRALAX) powder 255 grams one bottle for colonoscopy prep 255 g 0  . triamterene-hydrochlorothiazide (MAXZIDE-25) 37.5-25 MG tablet TAKE 1 TABLET BY MOUTH DAILY. 90 tablet 1  . Vitamin D, Ergocalciferol, (DRISDOL) 50000 units CAPS capsule Take 1 capsule (50,000 Units total) by mouth every 7 (seven) days. 12 capsule 0  . zolmitriptan (ZOMIG) 5 MG tablet Take 1 tablet (5 mg total) by mouth once as needed. May repeat in two hours as needed 12 tablet 12   No current facility-administered medications for this visit.     No Known Allergies  Family History  Problem Relation Age of Onset  . Lung disease Mother   . Hypertension Mother   . Kidney disease Mother   . Diabetes Mother   . Colon polyps Mother  colon resection  . Hypertension Maternal Grandmother   . Breast cancer Neg Hx   . Colon cancer Neg Hx     Social History   Social History  . Marital status: Legally Separated    Spouse name: N/A  . Number of children: N/A  . Years of education: N/A   Occupational History  . Not on file.   Social History Main Topics  . Smoking status: Never Smoker  . Smokeless tobacco: Never Used  . Alcohol use 0.0 oz/week     Comment: occasional  . Drug use: No  . Sexual activity: Yes    Birth control/ protection: Inserts   Other Topics Concern  . Not on file   Social History Narrative  . No narrative on file     Constitutional: Pt reports headaches. Denies fever, malaise, fatigue, headache or abrupt weight changes.    HEENT: Denies eye pain, eye redness, ear pain, ringing in the ears, wax buildup, runny nose, nasal congestion, bloody nose, or sore throat. Respiratory: Denies difficulty breathing, shortness of breath, cough or sputum production.   Cardiovascular: Denies chest pain, chest tightness, palpitations or swelling in the hands or feet.  Gastrointestinal: Denies abdominal pain, bloating, constipation, diarrhea or blood in the stool.  GU: Denies urgency, frequency, pain with urination, burning sensation, blood in urine, odor or discharge. Musculoskeletal: Denies decrease in range of motion, difficulty with gait, muscle pain or joint pain and swelling.  Skin: Denies redness, rashes, lesions or ulcercations.  Neurological: Denies dizziness, difficulty with memory, difficulty with speech or problems with balance and coordination.  Psych: Pt reports anxiety and depression. Denies SI/HI.  No other specific complaints in a complete review of systems (except as listed in HPI above).  Objective:   Physical Exam   BP 126/84   Pulse 81   Temp 98.3 F (36.8 C) (Oral)   Wt 202 lb (91.6 kg)   LMP 10/04/2016   SpO2 98%   BMI 39.45 kg/m  Wt Readings from Last 3 Encounters:  11/12/16 202 lb (91.6 kg)  09/25/16 190 lb (86.2 kg)  09/05/16 200 lb (90.7 kg)    General: Appears her stated age, obese in NAD. HEENT: Head: normal shape and size; Eyes: sclera white, no icterus, conjunctiva pink, PERRLA and EOMs intact;  Neurological: Alert and oriented.  Coordination normal.  Psychiatric: Mood and affect normal. Behavior is normal. Judgment and thought content normal.     BMET    Component Value Date/Time   NA 138 07/09/2016 1116   NA 136 06/16/2015 1442   NA 137 08/15/2012 0949   K 3.7 07/09/2016 1116   K 3.8 08/15/2012 0949   CL 102 07/09/2016 1116   CL 104 08/15/2012 0949   CO2 29 07/09/2016 1116   CO2 26 08/15/2012 0949   GLUCOSE 134 (H) 07/09/2016 1116   GLUCOSE 92 08/15/2012 0949   BUN 19  07/09/2016 1116   BUN 15 06/16/2015 1442   BUN 16 08/15/2012 0949   CREATININE 1.07 07/09/2016 1116   CREATININE 0.89 08/15/2012 0949   CALCIUM 9.9 07/09/2016 1116   CALCIUM 9.0 08/15/2012 0949   GFRNONAA 74 06/16/2015 1442   GFRNONAA >60 08/15/2012 0949   GFRAA 85 06/16/2015 1442   GFRAA >60 08/15/2012 0949    Lipid Panel     Component Value Date/Time   CHOL 155 09/16/2014   TRIG 117 09/16/2014   HDL 56 09/16/2014   HDL 36 09/16/2014   LDLCALC 96 09/16/2014  CBC    Component Value Date/Time   WBC 5.8 07/09/2016 1116   RBC 5.05 07/09/2016 1116   HGB 13.7 07/09/2016 1116   HGB 13.6 06/16/2015 1442   HCT 41.2 07/09/2016 1116   HCT 40.4 06/16/2015 1442   PLT 278.0 07/09/2016 1116   PLT 302 06/16/2015 1442   MCV 81.7 07/09/2016 1116   MCV 82 06/16/2015 1442   MCV 82 08/15/2012 0949   MCH 27.5 06/16/2015 1442   MCH 27.4 12/22/2014 1052   MCHC 33.3 07/09/2016 1116   RDW 12.8 07/09/2016 1116   RDW 13.0 06/16/2015 1442   RDW 12.7 08/15/2012 0949   LYMPHSABS 2.0 12/22/2014 1052   MONOABS 0.4 12/22/2014 1052   EOSABS 0.2 12/22/2014 1052   BASOSABS 0.1 12/22/2014 1052    Hgb A1C Lab Results  Component Value Date   HGBA1C 5.8 07/09/2016           Assessment & Plan:   Migraines, Anxiety and Depression:  Worse d/t increased stress Will add Zoloft to Wellbutrin Continue Zomig and Metoprolol She will update me in 4 weeks, sooner if worse  Vit D Deficiency:  Vit D level today  RTC as needed or if symptoms persist or worsen Mallory Reiland, NP

## 2016-11-13 LAB — VITAMIN D 25 HYDROXY (VIT D DEFICIENCY, FRACTURES): VITD: 31.96 ng/mL (ref 30.00–100.00)

## 2016-11-18 ENCOUNTER — Encounter: Payer: Self-pay | Admitting: Internal Medicine

## 2016-12-04 ENCOUNTER — Other Ambulatory Visit: Payer: Self-pay | Admitting: Internal Medicine

## 2016-12-12 ENCOUNTER — Other Ambulatory Visit: Payer: Self-pay | Admitting: Internal Medicine

## 2017-01-10 ENCOUNTER — Other Ambulatory Visit: Payer: Self-pay

## 2017-01-10 NOTE — Telephone Encounter (Signed)
Looks like last prescription was filled by previous PCP... Please advise

## 2017-01-13 ENCOUNTER — Ambulatory Visit (INDEPENDENT_AMBULATORY_CARE_PROVIDER_SITE_OTHER): Payer: 59 | Admitting: Internal Medicine

## 2017-01-13 ENCOUNTER — Encounter: Payer: Self-pay | Admitting: Internal Medicine

## 2017-01-13 VITALS — BP 134/86 | HR 85 | Temp 98.1°F | Wt 203.0 lb

## 2017-01-13 DIAGNOSIS — F419 Anxiety disorder, unspecified: Secondary | ICD-10-CM | POA: Diagnosis not present

## 2017-01-13 DIAGNOSIS — R51 Headache: Secondary | ICD-10-CM | POA: Diagnosis not present

## 2017-01-13 DIAGNOSIS — I10 Essential (primary) hypertension: Secondary | ICD-10-CM | POA: Diagnosis not present

## 2017-01-13 DIAGNOSIS — R350 Frequency of micturition: Secondary | ICD-10-CM | POA: Diagnosis not present

## 2017-01-13 DIAGNOSIS — R519 Headache, unspecified: Secondary | ICD-10-CM

## 2017-01-13 LAB — POC URINALSYSI DIPSTICK (AUTOMATED)
Bilirubin, UA: NEGATIVE
Glucose, UA: NEGATIVE
Ketones, UA: NEGATIVE
Leukocytes, UA: NEGATIVE
Nitrite, UA: NEGATIVE
PROTEIN UA: NEGATIVE
Urobilinogen, UA: 0.2 E.U./dL
pH, UA: 6 (ref 5.0–8.0)

## 2017-01-13 MED ORDER — METOPROLOL SUCCINATE ER 100 MG PO TB24: 100.0000 mg | ORAL_TABLET | Freq: Every day | ORAL | 0 refills | Status: DC

## 2017-01-13 NOTE — Progress Notes (Signed)
Subjective:    Patient ID: Mallory Meyers, female    DOB: [birth year], 46 y.o.   MRN: [id number]  HPI  Pt presents to the clinic today with c/o persistent headaches. She is not sure if this was related to her blood pressure, or being triggered by something else. She reports her blood pressures have been ranging 140/84, 174/94. She is taking Triamterene-HCT and Metoprolol as prescribed for HTN, although she ran out of Metoprolol 2 weeks ago. She is taking Flexeril, Naratriptan or Zolmetriptan as needed for the headaches with some relief. She also occasionally takes BC Powders. Her BP today is 134/86. She has been under a lot of stress lately and reports she is only taking the Wellbutrin, not the Zoloft.  She also c/o urinary frequency. She reports this started 1 week ago. She denies urgency, dysuria, pelvic pain or low back pain. She denies fever, chills or nausea. She has not taken anything OTC for her symptoms. She is concerned about diabetes, reports her A1C was 5.8% 3 months ago and wants to know if she should have this rechecked. She has been under more stress lately and reports she has been consuming more caffeine than usual.  Review of Systems      Past Medical History:  Diagnosis Date  . Anemia   . Depression   . Headache   . Hypertension   . Polycystic ovaries   . Seizures ([place])    AS A CHILD    Current Outpatient Prescriptions  Medication Sig Dispense Refill  . buPROPion (WELLBUTRIN XL) 300 MG 24 hr tablet Take 1 tablet (300 mg total) by mouth daily. 90 tablet 3  . cyclobenzaprine (FLEXERIL) 10 MG tablet Take 1 tablet (10 mg total) by mouth at bedtime as needed for muscle spasms. 30 tablet 0  . metoprolol succinate (TOPROL-XL) 100 MG 24 hr tablet Take 1 tablet (100 mg total) by mouth daily. MUST SCHEDULE ANNUAL EXAM FOR REFILLS 90 tablet 0  . Multiple Vitamins-Minerals (ONE-A-DAY WOMENS 50 PLUS PO) Take 1 tablet by mouth daily at 12 noon.    . naproxen (NAPROSYN) 500 MG tablet  TAKE 1 TABLET (500 MG TOTAL) BY MOUTH 2 (TWO) TIMES DAILY WITH A MEAL. 30 tablet 0  . naratriptan (AMERGE) 2.5 MG tablet Take 1 tablet (2.5 mg total) by mouth as needed for migraine. Take one (1) tablet at onset of headache; may repeat in 4 hours if needed 10 tablet 12  . NUVARING 0.12-0.015 MG/24HR vaginal ring Place 1 each vaginally daily. Insert 1 vaginal ring by vaginal route every 21 days for 3 rounds, then leave out for 7 days. 3 each 4  . phentermine (ADIPEX-P) 37.5 MG tablet Take 1 tablet (37.5 mg total) by mouth daily before breakfast. 30 tablet 0  . polyethylene glycol powder (GLYCOLAX/MIRALAX) powder 255 grams one bottle for colonoscopy prep 255 g 0  . sertraline (ZOLOFT) 25 MG tablet Take 1 tablet (25 mg total) by mouth daily. 30 tablet 2  . triamterene-hydrochlorothiazide (MAXZIDE-25) 37.5-25 MG tablet TAKE 1 TABLET BY MOUTH DAILY. 90 tablet 1  . Vitamin D, Ergocalciferol, (DRISDOL) 50000 units CAPS capsule Take 1 capsule (50,000 Units total) by mouth every 7 (seven) days. 12 capsule 0  . zolmitriptan (ZOMIG) 5 MG tablet Take 1 tablet (5 mg total) by mouth once as needed. May repeat in two hours as needed 12 tablet 12   No current facility-administered medications for this visit.     No Known Allergies  Family History  Problem Relation Age of Onset  . Lung disease Mother   . Hypertension Mother   . Kidney disease Mother   . Diabetes Mother   . Colon polyps Mother        colon resection  . Hypertension Maternal Grandmother   . Breast cancer Neg Hx   . Colon cancer Neg Hx     Social History   Social History  . Marital status: Legally Separated    Spouse name: N/A  . Number of children: N/A  . Years of education: N/A   Occupational History  . Not on file.   Social History Main Topics  . Smoking status: Never Smoker  . Smokeless tobacco: Never Used  . Alcohol use 0.0 oz/week     Comment: occasional  . Drug use: No  . Sexual activity: Yes    Birth control/  protection: Inserts   Other Topics Concern  . Not on file   Social History Narrative  . No narrative on file     Constitutional: Pt reports headaches. Denies fever, malaise, fatigue, headache or abrupt weight changes.  Respiratory: Denies difficulty breathing, shortness of breath, cough or sputum production.   Cardiovascular: Denies chest pain, chest tightness, palpitations or swelling in the hands or feet.  Gastrointestinal: Denies abdominal pain, bloating, constipation, diarrhea or blood in the stool.  GU: Pt reports urinary frequency. Denies urgency, pain with urination, burning sensation, blood in urine, odor or discharge. Neurological: Denies dizziness, difficulty with memory, difficulty with speech or problems with balance and coordination.  Psych: Pt reports anxiety. Denies depression, SI/HI.  No other specific complaints in a complete review of systems (except as listed in HPI above).   Objective:   Physical Exam   BP 134/86   Pulse 85   Temp 98.1 F (36.7 C) (Oral)   Wt 203 lb (92.1 kg)   LMP 11/07/2016   SpO2 100%   BMI 39.65 kg/m  Wt Readings from Last 3 Encounters:  01/13/17 203 lb (92.1 kg)  11/12/16 202 lb (91.6 kg)  09/25/16 190 lb (86.2 kg)    General: Appears her stated age, obese in NAD. Cardiovascular: Normal rate and rhythm.  Pulmonary/Chest: Normal effort and positive vesicular breath sounds. No respiratory distress. No wheezes, rales or ronchi noted.  Abdomen: Soft and nontender. Normal bowel sounds. No distention or masses noted. No CVA tenderness noted. Neurological: Alert and oriented. Psychiatric: She is mildly anxious appearing today.  BMET    Component Value Date/Time   NA 138 07/09/2016 1116   NA 136 06/16/2015 1442   NA 137 08/15/2012 0949   K 3.7 07/09/2016 1116   K 3.8 08/15/2012 0949   CL 102 07/09/2016 1116   CL 104 08/15/2012 0949   CO2 29 07/09/2016 1116   CO2 26 08/15/2012 0949   GLUCOSE 134 (H) 07/09/2016 1116   GLUCOSE  92 08/15/2012 0949   BUN 19 07/09/2016 1116   BUN 15 06/16/2015 1442   BUN 16 08/15/2012 0949   CREATININE 1.07 07/09/2016 1116   CREATININE 0.89 08/15/2012 0949   CALCIUM 9.9 07/09/2016 1116   CALCIUM 9.0 08/15/2012 0949   GFRNONAA 74 06/16/2015 1442   GFRNONAA >60 08/15/2012 0949   GFRAA 85 06/16/2015 1442   GFRAA >60 08/15/2012 0949    Lipid Panel     Component Value Date/Time   CHOL 155 09/16/2014   TRIG 117 09/16/2014   HDL 56 09/16/2014   HDL 36 09/16/2014   LDLCALC 96 09/16/2014  CBC    Component Value Date/Time   WBC 5.8 07/09/2016 1116   RBC 5.05 07/09/2016 1116   HGB 13.7 07/09/2016 1116   HGB 13.6 06/16/2015 1442   HCT 41.2 07/09/2016 1116   HCT 40.4 06/16/2015 1442   PLT 278.0 07/09/2016 1116   PLT 302 06/16/2015 1442   MCV 81.7 07/09/2016 1116   MCV 82 06/16/2015 1442   MCV 82 08/15/2012 0949   MCH 27.5 06/16/2015 1442   MCH 27.4 12/22/2014 1052   MCHC 33.3 07/09/2016 1116   RDW 12.8 07/09/2016 1116   RDW 13.0 06/16/2015 1442   RDW 12.7 08/15/2012 0949   LYMPHSABS 2.0 12/22/2014 1052   MONOABS 0.4 12/22/2014 1052   EOSABS 0.2 12/22/2014 1052   BASOSABS 0.1 12/22/2014 1052    Hgb A1C Lab Results  Component Value Date   HGBA1C 5.8 07/09/2016           Assessment & Plan:   HTN, Headaches, secondary to uncontrolled Anxiety:  Metoprolol refilled today Continue Triamterene HCT Continue Naritriptan and Zolmitriptan as needed Try to avoid BC powders Continue Wellbutrin Discussed the role of SSRI in anxiety management and encouraged her to start taking this and follow up with me in 4-6 weeks We will not add additional BP meds at this time but will monitor  Urinary Frequency:  Urinalysis: + blood (she is having vaginal spotting today) No need to send urine culture Avoid caffeine Drink lots of water  Return precuations discussed Mallory Embry, NP   Return precautions discussed Mallory Silversmith, NP

## 2017-01-13 NOTE — Patient Instructions (Signed)

## 2017-01-13 NOTE — Addendum Note (Signed)
Addended by: Lurlean Nanny on: 01/13/2017 12:24 PM   Modules accepted: Orders

## 2017-03-29 ENCOUNTER — Other Ambulatory Visit: Payer: Self-pay | Admitting: Internal Medicine

## 2017-03-29 DIAGNOSIS — G43919 Migraine, unspecified, intractable, without status migrainosus: Secondary | ICD-10-CM

## 2017-03-31 NOTE — Telephone Encounter (Signed)
Last filled 03/28/2016 w/ 11 refills... Please advise... CPE reminder letter mailed

## 2017-04-09 ENCOUNTER — Other Ambulatory Visit: Payer: Self-pay | Admitting: Internal Medicine

## 2017-04-16 ENCOUNTER — Telehealth: Payer: Self-pay | Admitting: Internal Medicine

## 2017-04-16 NOTE — Telephone Encounter (Signed)
Copied from [place] [id number]. Topic: Quick Communication - See Telephone Encounter >> Apr 16, 2017 10:09 AM Synthia Innocent wrote: CRM for notification. See Telephone encounter for:  [facility] in [place] calling, Patient insurance is requiring a 90 day supply of sertraline (ZOLOFT) 25 MG tablet. Please advise, was faxed over by pharmacy 04/12/17 and 04/15/17 04/16/17.

## 2017-04-16 NOTE — Telephone Encounter (Signed)
What is pt supposed to be taking... After reviewing chart and the last fill date, it does not look like she has been taking or has not been taking daily... Please advise... Pt last seen 01/13/17 for f/u and for anxiety

## 2017-04-17 MED ORDER — SERTRALINE HCL 25 MG PO TABS: 25.0000 mg | ORAL_TABLET | Freq: Every day | ORAL | 0 refills | Status: DC

## 2017-04-17 NOTE — Addendum Note (Signed)
Addended by: Lurlean Nanny on: 04/17/2017 04:07 PM   Modules accepted: Orders

## 2017-04-17 NOTE — Telephone Encounter (Signed)
At her last visit, she was only taking Wellbutrin, not Zoloft. I had advised her to start taking the Zoloft daily. Ok to send in 90 day supply.

## 2017-04-17 NOTE — Telephone Encounter (Signed)
Rx sent through e-scribe  

## 2017-04-26 ENCOUNTER — Other Ambulatory Visit: Payer: Self-pay | Admitting: Internal Medicine

## 2017-04-26 DIAGNOSIS — F39 Unspecified mood [affective] disorder: Secondary | ICD-10-CM

## 2017-04-30 ENCOUNTER — Ambulatory Visit: Payer: 59 | Admitting: Internal Medicine

## 2017-04-30 ENCOUNTER — Encounter: Payer: Self-pay | Admitting: Internal Medicine

## 2017-04-30 DIAGNOSIS — I10 Essential (primary) hypertension: Secondary | ICD-10-CM

## 2017-04-30 DIAGNOSIS — F39 Unspecified mood [affective] disorder: Secondary | ICD-10-CM

## 2017-04-30 DIAGNOSIS — M545 Low back pain, unspecified: Secondary | ICD-10-CM | POA: Insufficient documentation

## 2017-04-30 DIAGNOSIS — G43919 Migraine, unspecified, intractable, without status migrainosus: Secondary | ICD-10-CM | POA: Diagnosis not present

## 2017-04-30 DIAGNOSIS — G8929 Other chronic pain: Secondary | ICD-10-CM | POA: Diagnosis not present

## 2017-04-30 MED ORDER — BUPROPION HCL ER (XL) 300 MG PO TB24: 300.0000 mg | ORAL_TABLET | Freq: Every day | ORAL | 3 refills | Status: DC

## 2017-04-30 MED ORDER — NARATRIPTAN HCL 2.5 MG PO TABS: 2.5000 mg | ORAL_TABLET | ORAL | 3 refills | Status: DC | PRN

## 2017-04-30 MED ORDER — METOPROLOL SUCCINATE ER 100 MG PO TB24: 100.0000 mg | ORAL_TABLET | Freq: Every day | ORAL | 3 refills | Status: DC

## 2017-04-30 MED ORDER — SERTRALINE HCL 25 MG PO TABS: 25.0000 mg | ORAL_TABLET | Freq: Every day | ORAL | 3 refills | Status: DC

## 2017-04-30 MED ORDER — ZOLMITRIPTAN 5 MG PO TABS: 5.0000 mg | ORAL_TABLET | Freq: Once | ORAL | 3 refills | Status: DC | PRN

## 2017-04-30 MED ORDER — NAPROXEN 500 MG PO TABS: 500.0000 mg | ORAL_TABLET | Freq: Two times a day (BID) | ORAL | 3 refills | Status: DC

## 2017-04-30 MED ORDER — TRIAMTERENE-HCTZ 37.5-25 MG PO TABS: 1.0000 | ORAL_TABLET | Freq: Every day | ORAL | 3 refills | Status: DC

## 2017-04-30 NOTE — Assessment & Plan Note (Signed)
Controlled on multiple meds Refilled today Reinforced DASH diet and exercise for weight loss

## 2017-04-30 NOTE — Patient Instructions (Signed)

## 2017-04-30 NOTE — Progress Notes (Signed)
Subjective:    Patient ID: Mallory Meyers, female    DOB: 1970-09-06, 47 y.o.   MRN: [id number]  HPI  Pt presents to the clinic today to follow up chronic conditions.  HTN: Her BP today is 114/76. She is taking Metoprolol and Triamterene HCT as prescribed. She denies chest pain, swelling or shortness of breath.  Depression: Chronic but stable on Zoloft and Wellbutrin. She denies anxiety, SI/HI. She is requesting refills of her medication today.  Migraines: These typically only occur around her period. She alternates Naratriptan and Zolmitriptan as needed with good relief.  Chronic Low Back Pain: Controlled with Naproxen. She reports she has taken Flexeril in the past as well, but not daily.  Review of Systems      Past Medical History:  Diagnosis Date  . Anemia   . Depression   . Headache   . Hypertension   . Polycystic ovaries   . Seizures ([place])    AS A CHILD    Current Outpatient Medications  Medication Sig Dispense Refill  . buPROPion (WELLBUTRIN XL) 300 MG 24 hr tablet Take 1 tablet (300 mg total) by mouth daily. 90 tablet 3  . cyclobenzaprine (FLEXERIL) 10 MG tablet Take 1 tablet (10 mg total) by mouth at bedtime as needed for muscle spasms. 30 tablet 0  . metoprolol succinate (TOPROL-XL) 100 MG 24 hr tablet TAKE 1 TABLET (100 MG TOTAL) BY MOUTH DAILY. MUST SCHEDULE ANNUAL EXAM FOR REFILLS 90 tablet 0  . Multiple Vitamins-Minerals (ONE-A-DAY WOMENS 50 PLUS PO) Take 1 tablet by mouth daily at 12 noon.    . naproxen (NAPROSYN) 500 MG tablet TAKE 1 TABLET (500 MG TOTAL) BY MOUTH 2 (TWO) TIMES DAILY WITH A MEAL. 30 tablet 0  . naratriptan (AMERGE) 2.5 MG tablet Take 1 tablet (2.5 mg total) by mouth as needed for migraine. Take one (1) tablet at onset of headache; may repeat in 4 hours if needed 10 tablet 12  . NUVARING 0.12-0.015 MG/24HR vaginal ring Place 1 each vaginally daily. Insert 1 vaginal ring by vaginal route every 21 days for 3 rounds, then leave out for 7 days. 3  each 4  . polyethylene glycol powder (GLYCOLAX/MIRALAX) powder 255 grams one bottle for colonoscopy prep 255 g 0  . sertraline (ZOLOFT) 25 MG tablet Take 1 tablet (25 mg total) by mouth daily. MUST SCHEDULE ANNUAL PHYSICAL 90 tablet 0  . triamterene-hydrochlorothiazide (MAXZIDE-25) 37.5-25 MG tablet TAKE 1 TABLET BY MOUTH DAILY. 90 tablet 1  . zolmitriptan (ZOMIG) 5 MG tablet TAKE 1 TABLET (5 MG TOTAL) BY MOUTH ONCE AS NEEDED. MAY REPEAT IN TWO HOURS AS NEEDED 12 tablet 1   No current facility-administered medications for this visit.     No Known Allergies  Family History  Problem Relation Age of Onset  . Lung disease Mother   . Hypertension Mother   . Kidney disease Mother   . Diabetes Mother   . Colon polyps Mother        colon resection  . Hypertension Maternal Grandmother   . Breast cancer Neg Hx   . Colon cancer Neg Hx     Social History   Socioeconomic History  . Marital status: Legally Separated    Spouse name: Not on file  . Number of children: Not on file  . Years of education: Not on file  . Highest education level: Not on file  Social Needs  . Financial resource strain: Not on file  . Food insecurity -  worry: Not on file  . Food insecurity - inability: Not on file  . Transportation needs - medical: Not on file  . Transportation needs - non-medical: Not on file  Occupational History  . Not on file  Tobacco Use  . Smoking status: Never Smoker  . Smokeless tobacco: Never Used  Substance and Sexual Activity  . Alcohol use: Yes    Alcohol/week: 0.0 oz    Comment: occasional  . Drug use: No  . Sexual activity: Yes    Birth control/protection: Inserts  Other Topics Concern  . Not on file  Social History Narrative  . Not on file     Constitutional: Pt reports intermittent headaches. Denies fever, malaise, fatigue, or abrupt weight changes.  Respiratory: Denies difficulty breathing, shortness of breath, cough or sputum production.   Cardiovascular: Denies  chest pain, chest tightness, palpitations or swelling in the hands or feet.  Musculoskeletal: Pt reports back pain. Denies decrease in range of motion, difficulty with gait, or joint pain and swelling.  Neurological: Denies dizziness, difficulty with memory, difficulty with speech or problems with balance and coordination.  Psych: Pt has a history of depression. Denies anxiety,  SI/HI.  No other specific complaints in a complete review of systems (except as listed in HPI above).  Objective:   Physical Exam   BP 114/76   Pulse 91   Temp 98.1 F (36.7 C) (Oral)   Wt 211 lb 4 oz (95.8 kg)   LMP 04/28/2017   SpO2 99%   BMI 41.26 kg/m  Wt Readings from Last 3 Encounters:  04/30/17 211 lb 4 oz (95.8 kg)  01/13/17 203 lb (92.1 kg)  11/12/16 202 lb (91.6 kg)    General: Appears her stated age, obese in NAD. Cardiovascular: Normal rate and rhythm. S1,S2 noted.  No murmur, rubs or gallops noted.  Pulmonary/Chest: Normal effort and positive vesicular breath sounds. No respiratory distress. No wheezes, rales or ronchi noted.  Musculoskeletal: Normal flexion, extension and rotation of the spine. Bony tenderness noted over the lumbar spine. No difficulty with gait.  Neurological: Alert and oriented.  Psychiatric: Mood and affect normal. Behavior is normal. Judgment and thought content normal.    BMET    Component Value Date/Time   NA 138 07/09/2016 1116   NA 136 06/16/2015 1442   NA 137 08/15/2012 0949   K 3.7 07/09/2016 1116   K 3.8 08/15/2012 0949   CL 102 07/09/2016 1116   CL 104 08/15/2012 0949   CO2 29 07/09/2016 1116   CO2 26 08/15/2012 0949   GLUCOSE 134 (H) 07/09/2016 1116   GLUCOSE 92 08/15/2012 0949   BUN 19 07/09/2016 1116   BUN 15 06/16/2015 1442   BUN 16 08/15/2012 0949   CREATININE 1.07 07/09/2016 1116   CREATININE 0.89 08/15/2012 0949   CALCIUM 9.9 07/09/2016 1116   CALCIUM 9.0 08/15/2012 0949   GFRNONAA 74 06/16/2015 1442   GFRNONAA >60 08/15/2012 0949    GFRAA 85 06/16/2015 1442   GFRAA >60 08/15/2012 0949    Lipid Panel     Component Value Date/Time   CHOL 155 09/16/2014   TRIG 117 09/16/2014   HDL 56 09/16/2014   HDL 36 09/16/2014   LDLCALC 96 09/16/2014    CBC    Component Value Date/Time   WBC 5.8 07/09/2016 1116   RBC 5.05 07/09/2016 1116   HGB 13.7 07/09/2016 1116   HGB 13.6 06/16/2015 1442   HCT 41.2 07/09/2016 1116   HCT 40.4 06/16/2015  1442   PLT 278.0 07/09/2016 1116   PLT 302 06/16/2015 1442   MCV 81.7 07/09/2016 1116   MCV 82 06/16/2015 1442   MCV 82 08/15/2012 0949   MCH 27.5 06/16/2015 1442   MCH 27.4 12/22/2014 1052   MCHC 33.3 07/09/2016 1116   RDW 12.8 07/09/2016 1116   RDW 13.0 06/16/2015 1442   RDW 12.7 08/15/2012 0949   LYMPHSABS 2.0 12/22/2014 1052   MONOABS 0.4 12/22/2014 1052   EOSABS 0.2 12/22/2014 1052   BASOSABS 0.1 12/22/2014 1052    Hgb A1C Lab Results  Component Value Date   HGBA1C 5.8 07/09/2016           Assessment & Plan:

## 2017-04-30 NOTE — Assessment & Plan Note (Signed)
Chronic but stable on Wellbutrin and Zoloft Support offered today

## 2017-04-30 NOTE — Assessment & Plan Note (Signed)
Encouraged core strengthening and weight loss Naproxen refilled today

## 2017-04-30 NOTE — Assessment & Plan Note (Signed)
Continue zolmitriptan and naritriptan prn  Refilled today

## 2017-08-15 ENCOUNTER — Other Ambulatory Visit: Payer: Self-pay | Admitting: Internal Medicine

## 2017-08-15 DIAGNOSIS — G43919 Migraine, unspecified, intractable, without status migrainosus: Secondary | ICD-10-CM

## 2017-09-27 ENCOUNTER — Ambulatory Visit (INDEPENDENT_AMBULATORY_CARE_PROVIDER_SITE_OTHER): Payer: 59

## 2017-09-27 ENCOUNTER — Encounter: Payer: Self-pay | Admitting: Gynecology

## 2017-09-27 ENCOUNTER — Other Ambulatory Visit: Payer: Self-pay

## 2017-09-27 ENCOUNTER — Ambulatory Visit
Admission: EM | Admit: 2017-09-27 | Discharge: 2017-09-27 | Disposition: A | Discharge status: Home / Self Care | Payer: 59 | Attending: Emergency Medicine | Admitting: Emergency Medicine

## 2017-09-27 DIAGNOSIS — S8002XA Contusion of left knee, initial encounter: Secondary | ICD-10-CM | POA: Diagnosis not present

## 2017-09-27 DIAGNOSIS — M25562 Pain in left knee: Secondary | ICD-10-CM

## 2017-09-27 DIAGNOSIS — S80212A Abrasion, left knee, initial encounter: Secondary | ICD-10-CM

## 2017-09-27 DIAGNOSIS — W108XXA Fall (on) (from) other stairs and steps, initial encounter: Secondary | ICD-10-CM

## 2017-09-27 DIAGNOSIS — M7052 Other bursitis of knee, left knee: Secondary | ICD-10-CM | POA: Diagnosis not present

## 2017-09-27 NOTE — Discharge Instructions (Signed)
Use knee immobilizer for activities  for comfort. Apply ice 20 minutes out of every 2 hours 4-5 times daily for comfort.  Use Naprosyn 500 mg twice a day with meals that you already have at home.  Recommend taking this for approximately 2 weeks.  Follow up with orthopedic surgeon if not improving

## 2017-09-27 NOTE — ED Triage Notes (Signed)
Per patient c/o x 1 week ago fell down the stairways. Per patient was seen at [facility] urgent Care and was treated for the bruise on her left knee. Pt. Stated with right knee pain and request an xray.

## 2017-09-27 NOTE — ED Provider Notes (Addendum)
[facility]    CSN: [id number] Arrival date & time: 09/27/17  1531     History   Chief Complaint Chief Complaint  Patient presents with  . Knee Problem    HPI Mallory Meyers is a 47 y.o. female.   HPI  47 year old female presents with a abrasion on the left knee and continued knee pain after she fell approximately 09/18/2017.  She missed 1 of her steps that she was descending stairs and fell directly onto her knee.  Seen 5 days later at [facility] urgent care diagnosed with the abrasion and possible contusion of the knee placed on Bactroban which she has been using 2 times a day.  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  Her discomfort is anterior and she indicates the medial aspect.  She has had no popping locking or clicking.  Had no instability of the knee.  The area tends to burn.  She is complaining of a mild limp.        Past Medical History:  Diagnosis Date  . Anemia   . Depression   . Headache   . Hypertension   . Polycystic ovaries   . Seizures ([place])    AS A CHILD    Patient Active Problem List   Diagnosis Date Noted  . Chronic low back pain 04/30/2017  . Anemia 07/09/2016  . Allergic rhinitis due to allergen 07/09/2016  . Depression, recurrent ([place]) 07/09/2016  . Hypertension 08/14/2015  . Prediabetes 11/29/2014  . Intractable periodic headache syndrome 11/29/2014    Past Surgical History:  Procedure Laterality Date  . CHOLECYSTECTOMY  1997  . COLONOSCOPY WITH PROPOFOL N/A 09/25/2016   Procedure: COLONOSCOPY WITH PROPOFOL;  Surgeon: Christene Lye, MD;  Location: [facility] ENDOSCOPY;  Service: Endoscopy;  Laterality: N/A;  . KNEE SURGERY Left   . TOE ARTHROPLASTY Right 12/23/2014   Procedure: TOE ARTHROPLASTY;  Surgeon: Sharlotte Alamo, MD;  Location: [facility] ORS;  Service: Podiatry;  Laterality: Right;    OB History    Gravida  0   Para  0   Term  0   Preterm  0   AB  0     Living  0     SAB  0   TAB  0   Ectopic  0   Multiple  0   Live Births  0        Obstetric Comments  1st Menstrual Cycle:  11              Home Medications    Prior to Admission medications   Medication Sig Start Date End Date Taking? Authorizing Provider  buPROPion (WELLBUTRIN XL) 300 MG 24 hr tablet Take 1 tablet (300 mg total) by mouth daily. 04/30/17  Yes Baity, Coralie Keens, NP  cyclobenzaprine (FLEXERIL) 10 MG tablet Take 1 tablet (10 mg total) by mouth at bedtime as needed for muscle spasms. 07/17/15  Yes Plonk, Gwyndolyn Saxon, MD  metoprolol succinate (TOPROL-XL) 100 MG 24 hr tablet Take 1 tablet (100 mg total) by mouth daily. MUST SCHEDULE ANNUAL EXAM FOR REFILLS 04/30/17  Yes Baity, Coralie Keens, NP  Multiple Vitamins-Minerals (ONE-A-DAY WOMENS 50 PLUS PO) Take 1 tablet by mouth daily at 12 noon.   Yes [provider]  naproxen (NAPROSYN) 500 MG tablet Take 1 tablet (500 mg total) by mouth 2 (two) times daily with a meal. 04/30/17  Yes Baity, Coralie Keens, NP  naratriptan (AMERGE) 2.5 MG tablet Take 1 tablet (2.5 mg total) by mouth as needed for migraine. Take one (1) tablet at onset of headache; may repeat in 4 hours if needed 04/30/17  Yes Baity, Coralie Keens, NP  NUVARING 0.12-0.015 MG/24HR vaginal ring Place 1 each vaginally daily. Insert 1 vaginal ring by vaginal route every 21 days for 3 rounds, then leave out for 7 days. 08/30/16  Yes Gae Dry, MD  polyethylene glycol powder (GLYCOLAX/MIRALAX) powder 255 grams one bottle for colonoscopy prep 09/05/16  Yes Sankar, Seeplaputhur G, MD  sertraline (ZOLOFT) 25 MG tablet Take 1 tablet (25 mg total) by mouth daily. MUST SCHEDULE ANNUAL PHYSICAL 04/30/17  Yes Jearld Fenton, NP  triamterene-hydrochlorothiazide (MAXZIDE-25) 37.5-25 MG tablet Take 1 tablet by mouth daily. 04/30/17  Yes Baity, Coralie Keens, NP  zolmitriptan (ZOMIG) 5 MG tablet Take 1 tablet (5 mg total) by mouth once as needed. May repeat in two hours as needed 04/30/17   Jearld Fenton, NP  zolmitriptan (ZOMIG) 5 MG tablet TAKE 1 TABLET (5 MG TOTAL) BY MOUTH ONCE AS NEEDED. MAY REPEAT IN TWO HOURS AS NEEDED 08/19/17   Jearld Fenton, NP    Family History Family History  Problem Relation Age of Onset  . Lung disease Mother   . Hypertension Mother   . Kidney disease Mother   . Diabetes Mother   . Colon polyps Mother        colon resection  . Hypertension Maternal Grandmother   . Breast cancer Neg Hx   . Colon cancer Neg Hx     Social History Social History   Tobacco Use  . Smoking status: Never Smoker  . Smokeless tobacco: Never Used  Substance Use Topics  . Alcohol use: Yes    Alcohol/week: 0.0 oz    Comment: occasional  . Drug use: No     Allergies   Patient has no known allergies.   Review of Systems Review of Systems  Constitutional: Positive for activity change. Negative for chills, fatigue and fever.  Musculoskeletal: Positive for gait problem and myalgias.  Skin: Positive for wound.  All other systems reviewed and are negative.    Physical Exam Triage Vital Signs ED Triage Vitals  Enc Vitals Group     BP 09/27/17 1604 131/76     Pulse Rate 09/27/17 1604 88     Resp 09/27/17 1604 16     Temp 09/27/17 1604 98.2 F (36.8 C)     Temp Source 09/27/17 1604 Oral     SpO2 09/27/17 1604 100 %     Weight 09/27/17 1603 205 lb (93 kg)     Height 09/27/17 1603 5' (1.524 m)     Head Circumference --      Peak Flow --      Pain Score 09/27/17 1605 10     Pain Loc --      Pain Edu? --      Excl. in [place]? --    No data found.  Updated Vital Signs BP 131/76 (BP Location: Left Arm)   Pulse 88   Temp 98.2 F (36.8 C) (Oral)   Resp 16   Ht 5' (1.524 m)   Wt 205 lb (93 kg)   LMP 06/27/2017 Comment: Nuva Ring  SpO2 100%   BMI 40.04 kg/m   Visual Acuity Right Eye Distance:   Left Eye Distance:   Bilateral Distance:    Right Eye Near:   Left Eye Near:  Bilateral Near:     Physical Exam  Constitutional: She is oriented  to person, place, and time. She appears well-developed and well-nourished. No distress.  HENT:  Head: Normocephalic.  Eyes: Pupils are equal, round, and reactive to light.  Neck: Normal range of motion.  Musculoskeletal: She exhibits tenderness.  Emanation of the left knee shows abrasion anterior lateral aspect.  Measures approximately 5 cm in length and 4 cm in with.  He has limited range of motion particularly with flexion complaining of discomfort.  Quadriceps is strong with good contraction.  She does have mild patellofemoral pain.  Maximal tenderness is over the anterior medial knee over the peds anserine area extending up into the tibial plateau region.  Collateral ligaments are intact both medial and lateral.  She has a negative anterior drawer sign.  Neurological: She is alert and oriented to person, place, and time.  Skin: Skin is warm and dry. She is not diaphoretic.  Psychiatric: She has a normal mood and affect. Her behavior is normal. Judgment and thought content normal.  Nursing note and vitals reviewed.    UC Treatments / Results  Labs (all labs ordered are listed, but only abnormal results are displayed) Labs Reviewed - No data to display  EKG None  Radiology Dg Knee Complete 4 Views Left  Result Date: 09/27/2017 CLINICAL DATA:  Fall 1 week ago with persistent knee pain and swelling EXAM: LEFT KNEE - COMPLETE 4+ VIEW COMPARISON:  None. FINDINGS: Lucency is noted in the lateral tibial plateau suspicious for a minimally displaced tibial plateau fracture. No other fractures are seen. No other focal abnormality is noted. IMPRESSION: Changes suspicious for a lateral tibial plateau fracture. Cross-sectional imaging may be helpful for further evaluation as clinically necessary. Electronically Signed   By: Inez Catalina M.D.   On: 09/27/2017 16:37    Procedures Procedures (including critical care time)  Medications Ordered in UC Medications - No data to display  Initial  Impression / Assessment and Plan / UC Course  I have reviewed the triage vital signs and the nursing notes.  Pertinent labs & imaging results that were available during my care of the patient were reviewed by me and considered in my medical decision making (see chart for details).     Plan: 1. Test/x-ray results and diagnosis reviewed with patient 2. rx as per orders; risks, benefits, potential side effects reviewed with patient 3. Recommend supportive treatment with continued application of Bactroban ointment.  Use ice 20 minutes out of every 2 hours 4-5 times daily.  I provided her with a knee immobilizer to help assist her with pain with activities.  Is for comfort and does not require it at all times.  She is not improving recommend she follow-up with orthopedic surgery. 4. F/u prn if symptoms worsen or don't improve  Final Clinical Impressions(s) / UC Diagnoses   Final diagnoses:  Contusion of left knee, initial encounter  Abrasion, left knee, initial encounter  Pes anserinus bursitis of left knee     Discharge Instructions     Use knee immobilizer for activities  for comfort. Apply ice 20 minutes out of every 2 hours 4-5 times daily for comfort.  Use Naprosyn 500 mg twice a day with meals that you already have at home.  Recommend taking this for approximately 2 weeks.  Follow up with orthopedic surgeon if not improving   ED Prescriptions    None     Controlled Substance Prescriptions [place] Controlled Substance Registry consulted?  Not Applicable   Lorin Picket, PA-C 09/27/17 1704    Lorin Picket, PA-C 09/27/17 1706

## 2017-09-29 ENCOUNTER — Telehealth: Payer: Self-pay | Admitting: Emergency Medicine

## 2017-09-30 NOTE — Telephone Encounter (Signed)
A message was recieved on Monday (6/3) from patient who called regarding recent visit. Patient stated she was here on Saturday (6/1) for knee pain.  Patient had x-rays and was placed in a knee immobilizer. Patient stated she looked at her my chart and saw that radiology said suspicious for a fracture. Patient was concerned that no one had mentioned that at her visit.  Upon examining patients chart notes and radiology reports, the report does state the "possibility" of a tibial plateau fracture and suggested further radiology testing. Prior to returning the patients call, both the lead provider Garlon Hatchet) and the provider who initially saw patient Oswaldo Done) were consulted. They requested we make an appointment for patient ASAP with [facility] for further evaluation. Crutches and a work note as well as and instructions  to be non-weight bearing until evaluation at [facility] were (verbally) ordered. An appointment was made and a referral sent for patient with [facility] for 9:30am Wednesday 6/5.    This nurse then called the patient back with the above information and instructions.  Patient was advised we had crutches her work note and copy of xray for her here. Patients boy friend will to pick up everything after work on Monday.  Patient verbalized understanding of al information and will follow-up with [facility] on Wednesday.  [place]

## 2017-10-17 ENCOUNTER — Encounter: Payer: 59 | Admitting: Internal Medicine

## 2017-10-21 ENCOUNTER — Other Ambulatory Visit: Payer: Self-pay | Admitting: Obstetrics & Gynecology

## 2017-10-21 DIAGNOSIS — Z3044 Encounter for surveillance of vaginal ring hormonal contraceptive device: Secondary | ICD-10-CM

## 2017-11-07 ENCOUNTER — Other Ambulatory Visit: Payer: Self-pay | Admitting: Obstetrics & Gynecology

## 2017-11-07 ENCOUNTER — Telehealth: Payer: Self-pay

## 2017-11-07 DIAGNOSIS — Z3044 Encounter for surveillance of vaginal ring hormonal contraceptive device: Secondary | ICD-10-CM

## 2017-11-07 NOTE — Telephone Encounter (Signed)
Epic shows rx already sent.

## 2017-11-07 NOTE — Telephone Encounter (Signed)
Pt scheduled AE for 11/20/17. Pt requesting rf of Nuvaring to [facility] [place]. PQ#[phone]

## 2017-11-12 ENCOUNTER — Telehealth: Payer: Self-pay | Admitting: Obstetrics & Gynecology

## 2017-11-12 DIAGNOSIS — Z3044 Encounter for surveillance of vaginal ring hormonal contraceptive device: Secondary | ICD-10-CM

## 2017-11-12 NOTE — Telephone Encounter (Signed)
Sxh Annual w [place].  Will refill medicine until then.

## 2017-11-12 NOTE — Telephone Encounter (Signed)
Patient is schedule 11/20/17 with [facility] °

## 2017-11-20 ENCOUNTER — Ambulatory Visit (INDEPENDENT_AMBULATORY_CARE_PROVIDER_SITE_OTHER): Payer: 59 | Admitting: Obstetrics & Gynecology

## 2017-11-20 ENCOUNTER — Encounter: Payer: Self-pay | Admitting: Obstetrics & Gynecology

## 2017-11-20 VITALS — BP 128/80 | Ht 60.0 in | Wt 212.0 lb

## 2017-11-20 DIAGNOSIS — Z3044 Encounter for surveillance of vaginal ring hormonal contraceptive device: Secondary | ICD-10-CM

## 2017-11-20 DIAGNOSIS — Z1231 Encounter for screening mammogram for malignant neoplasm of breast: Secondary | ICD-10-CM | POA: Diagnosis not present

## 2017-11-20 DIAGNOSIS — Z0000 Encounter for general adult medical examination without abnormal findings: Secondary | ICD-10-CM

## 2017-11-20 DIAGNOSIS — Z01411 Encounter for gynecological examination (general) (routine) with abnormal findings: Secondary | ICD-10-CM

## 2017-11-20 DIAGNOSIS — Z1239 Encounter for other screening for malignant neoplasm of breast: Secondary | ICD-10-CM

## 2017-11-20 DIAGNOSIS — Z6841 Body Mass Index (BMI) 40.0 and over, adult: Secondary | ICD-10-CM

## 2017-11-20 MED ORDER — ETONOGESTREL-ETHINYL ESTRADIOL 0.12-0.015 MG/24HR VA RING: VAGINAL_RING | VAGINAL | 12 refills | Status: DC

## 2017-11-20 MED ORDER — DIETHYLPROPION HCL 25 MG PO TABS: 1.0000 | ORAL_TABLET | Freq: Every day | ORAL | 0 refills | Status: DC

## 2017-11-20 MED ORDER — ETONOGESTREL-ETHINYL ESTRADIOL 0.12-0.015 MG/24HR VA RING: VAGINAL_RING | VAGINAL | 4 refills | Status: DC

## 2017-11-20 NOTE — Progress Notes (Signed)
HPI:      [name] is a 47 y.o. G0P0000 who LMP was Patient's last menstrual period was 11/12/2017., she presents today for her annual examination. The patient has no complaints today other than continued weight gain concerns.  Periods controlled w Nuvaring. The patient is sexually active. Her last pap: approximate date 2018 and was normal and last mammogram: approximate date 2018 and was normal. The patient does perform self breast exams.  There is no notable family history of breast or ovarian cancer in her family.  The patient has regular exercise: yes.  The patient denies current symptoms of depression.    GYN History: Contraception: NuvaRing vaginal inserts  PMHx: Past Medical History:  Diagnosis Date  . Anemia   . Depression   . Headache   . Hypertension   . Polycystic ovaries   . Seizures ([place])    AS A CHILD   Past Surgical History:  Procedure Laterality Date  . CHOLECYSTECTOMY  1997  . COLONOSCOPY WITH PROPOFOL N/A 09/25/2016   Procedure: COLONOSCOPY WITH PROPOFOL;  Surgeon: Christene Lye, MD;  Location: [facility] ENDOSCOPY;  Service: Endoscopy;  Laterality: N/A;  . KNEE SURGERY Left   . TOE ARTHROPLASTY Right 12/23/2014   Procedure: TOE ARTHROPLASTY;  Surgeon: Sharlotte Alamo, MD;  Location: [facility] ORS;  Service: Podiatry;  Laterality: Right;   Family History  Problem Relation Age of Onset  . Lung disease Mother   . Hypertension Mother   . Kidney disease Mother   . Diabetes Mother   . Colon polyps Mother        colon resection  . Hypertension Maternal Grandmother   . Breast cancer Neg Hx   . Colon cancer Neg Hx    Social History   Tobacco Use  . Smoking status: Never Smoker  . Smokeless tobacco: Never Used  Substance Use Topics  . Alcohol use: Yes    Alcohol/week: 0.0 oz    Comment: occasional  . Drug use: No    Current Outpatient Medications:  .  buPROPion (WELLBUTRIN XL) 300 MG 24 hr tablet, Take 1 tablet (300 mg total) by mouth daily., Disp: 90  tablet, Rfl: 3 .  cyclobenzaprine (FLEXERIL) 10 MG tablet, Take 1 tablet (10 mg total) by mouth at bedtime as needed for muscle spasms., Disp: 30 tablet, Rfl: 0 .  etonogestrel-ethinyl estradiol (NUVARING) 0.12-0.015 MG/24HR vaginal ring, INSERT 1 RING VAGINALLY AS DIRECTED. REMOVE AND REPLACE AFTER 3 WEEKS FOR 3 PLACEMENTS, THEN WAIT 7 DAYS BEFORE INSERTING A NEW RING, Disp: 3 each, Rfl: 4 .  metoprolol succinate (TOPROL-XL) 100 MG 24 hr tablet, Take 1 tablet (100 mg total) by mouth daily. MUST SCHEDULE ANNUAL EXAM FOR REFILLS, Disp: 90 tablet, Rfl: 3 .  Multiple Vitamins-Minerals (ONE-A-DAY WOMENS 50 PLUS PO), Take 1 tablet by mouth daily at 12 noon., Disp: , Rfl:  .  naproxen (NAPROSYN) 500 MG tablet, Take 1 tablet (500 mg total) by mouth 2 (two) times daily with a meal., Disp: 180 tablet, Rfl: 3 .  naratriptan (AMERGE) 2.5 MG tablet, Take 1 tablet (2.5 mg total) by mouth as needed for migraine. Take one (1) tablet at onset of headache; may repeat in 4 hours if needed, Disp: 18 tablet, Rfl: 3 .  polyethylene glycol powder (GLYCOLAX/MIRALAX) powder, 255 grams one bottle for colonoscopy prep, Disp: 255 g, Rfl: 0 .  sertraline (ZOLOFT) 25 MG tablet, Take 1 tablet (25 mg total) by mouth daily. MUST SCHEDULE ANNUAL PHYSICAL, Disp: 90 tablet,  Rfl: 3 .  triamterene-hydrochlorothiazide (MAXZIDE-25) 37.5-25 MG tablet, Take 1 tablet by mouth daily., Disp: 90 tablet, Rfl: 3 .  zolmitriptan (ZOMIG) 5 MG tablet, Take 1 tablet (5 mg total) by mouth once as needed. May repeat in two hours as needed, Disp: 18 tablet, Rfl: 3 .  zolmitriptan (ZOMIG) 5 MG tablet, TAKE 1 TABLET (5 MG TOTAL) BY MOUTH ONCE AS NEEDED. MAY REPEAT IN TWO HOURS AS NEEDED, Disp: 12 tablet, Rfl: 1 .  Diethylpropion HCl 25 MG TABS, Take 1 tablet (25 mg total) by mouth daily., Disp: 30 each, Rfl: 0 Allergies: Patient has no known allergies.  Review of Systems  Constitutional: Negative for chills, fever and malaise/fatigue.  HENT: Negative for  congestion, sinus pain and sore throat.   Eyes: Negative for blurred vision and pain.  Respiratory: Negative for cough and wheezing.   Cardiovascular: Negative for chest pain and leg swelling.  Gastrointestinal: Negative for abdominal pain, constipation, diarrhea, heartburn, nausea and vomiting.  Genitourinary: Negative for dysuria, frequency, hematuria and urgency.  Musculoskeletal: Negative for back pain, joint pain, myalgias and neck pain.  Skin: Negative for itching and rash.  Neurological: Negative for dizziness, tremors and weakness.  Endo/Heme/Allergies: Does not bruise/bleed easily.  Psychiatric/Behavioral: Negative for depression. The patient is nervous/anxious. The patient does not have insomnia.     Objective: BP 128/80   Ht 5' (1.524 m)   Wt 212 lb (96.2 kg)   LMP 11/12/2017   BMI 41.40 kg/m   Filed Weights   11/20/17 0829  Weight: 212 lb (96.2 kg)   Body mass index is 41.4 kg/m. Physical Exam  Constitutional: She is oriented to person, place, and time. She appears well-developed and well-nourished. No distress.  Genitourinary: Rectum normal, vagina normal and uterus normal. Pelvic exam was performed with patient supine. There is no rash or lesion on the right labia. There is no rash or lesion on the left labia. Vagina exhibits no lesion. No bleeding in the vagina. Right adnexum does not display mass and does not display tenderness. Left adnexum does not display mass and does not display tenderness. Cervix does not exhibit motion tenderness, lesion, friability or polyp.   Uterus is mobile and midaxial. Uterus is not enlarged or exhibiting a mass.  HENT:  Head: Normocephalic and atraumatic. Head is without laceration.  Right Ear: Hearing normal.  Left Ear: Hearing normal.  Nose: No epistaxis.  No foreign bodies.  Mouth/Throat: Uvula is midline, oropharynx is clear and moist and mucous membranes are normal.  Eyes: Pupils are equal, round, and reactive to light.  Neck:  Normal range of motion. Neck supple. No thyromegaly present.  Cardiovascular: Normal rate and regular rhythm. Exam reveals no gallop and no friction rub.  No murmur heard. Pulmonary/Chest: Effort normal and breath sounds normal. No respiratory distress. She has no wheezes. Right breast exhibits no mass, no skin change and no tenderness. Left breast exhibits no mass, no skin change and no tenderness.  Abdominal: Soft. Bowel sounds are normal. She exhibits no distension. There is no tenderness. There is no rebound.  Musculoskeletal: Normal range of motion.  Neurological: She is alert and oriented to person, place, and time. No cranial nerve deficit.  Skin: Skin is warm and dry.  Psychiatric: She has a normal mood and affect. Judgment normal.  Vitals reviewed.  Assessment:  ANNUAL EXAM 1. Screening for breast cancer   2. Annual physical exam   3. Encounter for surveillance of vaginal ring hormonal contraceptive device  4. Obesity, Class III, BMI 40-49.9 (morbid obesity) ([place])    Screening Plan:            1.  Cervical Screening-  Pap smear schedule reviewed with patient  2. Breast screening- Exam annually and mammogram>40 planned   3. Colonoscopy every 10 years (UTD), Hemoccult testing - after age 58  4. Labs managed by PCP  5. Counseling for contraception: NuvaRing vaginal inserts, Rx provided  Period every third ring.  6. Obesity, weight loss discussed.  [place] Rx.  F/u one month    F/U  Return in about 1 month (around 12/18/2017) for Follow up.  Barnett Applebaum, MD, [facility], [facility] 11/20/2017  8:51 AM

## 2017-11-20 NOTE — Patient Instructions (Addendum)
PAP every three years Mammogram every year    Call [phone] to schedule at [facility] Colonoscopy every 10 years, 2028 Labs yearly (with PCP)  Diethylpropion tablets What is this medicine? DIETHYLPROPION (dye eth il PROE pee on) decreases your appetite. It is used with a reduced calorie diet and exercise to help you lose weight. This medicine is only meant to be used for a few weeks. This medicine may be used for other purposes; ask your health care provider or pharmacist if you have questions. COMMON BRAND NAME(S): Depletite # 2, Radtue, Tenuate What should I tell my health care provider before I take this medicine? They need to know if you have any of these conditions: -agitation -glaucoma -high blood pressure -history of drug dependence or substance abuse -hyperthyroid -kidney or liver disease -lung disease -valvular heart disease -an unusual or allergic reaction to diethylpropion, other medicines, foods, dyes, or preservatives -pregnant or trying to get pregnant -breast-feeding How should I use this medicine? Take this medicine by mouth with a glass of water. Follow the directions on the prescription label. Take this medicine 1 hour before meals. If a meal is missed, do not take that dose. An additional tablet may be taken in the mid evening if needed for night time hunger. Do not take your medicine more often than directed. Talk to your pediatrician regarding the use of this medicine in children. Special care may be needed. While this medicine may be prescribed for children as young as 16 years for selected conditions, precautions do apply. Overdosage: If you think you have taken too much of this medicine contact a poison control center or emergency room at once. NOTE: This medicine is only for you. Do not share this medicine with others. What if I miss a dose? If you miss a dose, take a dose as soon as you can with the next meal. If it is almost time for your next dose, take  only that dose. Do not take double or extra doses. What may interact with this medicine? Do not take this medicine with any of the following medications: -fluoxetine -MAOIs like Carbex, Eldepryl, Marplan, Nardil, and Parnate -medicines for colds or breathing difficulties like pseudoephedrine or phenylephrine -other medicines or herbal products for weight loss or to decrease appetite -procarbazine -sibutramine -stimulants like amphetamine, dextroamphetamine, dexmethylphenidate, methylphenidate or modafinil This medicine may also interact with the following medications: -general anesthetics -insulin and other medicines for diabetes -medicines for high blood pressure -phenothiazines like chlorpromazine, mesoridazine, prochlorperazine, thioridazine This list may not describe all possible interactions. Give your health care provider a list of all the medicines, herbs, non-prescription drugs, or dietary supplements you use. Also tell them if you smoke, drink alcohol, or use illegal drugs. Some items may interact with your medicine. What should I watch for while using this medicine? Visit your doctor or health care professional for regular checks on your progress. You need to closely monitor your weight loss. If your rate of weight loss slows down or stops, you may need to stop the medicine, and restart after a time without the medicine. You may get dizzy. Do not drive, use machinery, or do anything that needs mental alertness until you know how this medicine affects you. Do not stand or sit up quickly, especially if you are an older patient. This reduces the risk of dizzy or fainting spells. Alcohol may increase dizziness. Avoid alcoholic drinks. What side effects may I notice from receiving this medicine? Side effects that you should report  to your doctor or health care professional as soon as possible: -allergic reactions like skin rash, itching or hives, swelling of the face, lips, or  tongue -angry, confusion, more nervous, restless -breast growth in men -breathing problems -changes in vision -chest pain -difficulty with balance -fast, irregular heartbeat -hallucinations -nausea, vomiting -seizures -tremors -trouble sleeping Side effects that usually do not require medical attention (report to your doctor or health care professional if they continue or are bothersome): -constipation or diarrhea -dry mouth or unpleasant taste -flushing of the skin -hair loss -headache -increase or decrease in sexual desire or performance -menstrual irregularity -red or itchy skin -upset stomach This list may not describe all possible side effects. Call your doctor for medical advice about side effects. You may report side effects to FDA at 1-800-FDA-1088. Where should I keep my medicine? Keep out of the reach of children. This medicine can be abused. Keep your medicine in a safe place to protect it from theft. Do not share this medicine with anyone. Selling or giving away this medicine is dangerous and against the law. This medicine may cause accidental overdose and death if taken by other adults, children, or pets. Mix any unused medicine with a substance like cat litter or coffee grounds. Then throw the medicine away in a sealed container like a sealed bag or a coffee can with a lid. Do not use the medicine after the expiration date. Store at room temperature below 30 degrees C (86 degrees F). Protect from heat. Keep container tightly closed. NOTE: This sheet is a summary. It may not cover all possible information. If you have questions about this medicine, talk to your doctor, pharmacist, or health care provider.  2018 Elsevier/Gold Standard (2014-01-04 15:18:21)

## 2017-12-09 ENCOUNTER — Other Ambulatory Visit: Payer: Self-pay

## 2017-12-09 DIAGNOSIS — Z3044 Encounter for surveillance of vaginal ring hormonal contraceptive device: Secondary | ICD-10-CM

## 2017-12-09 MED ORDER — ETONOGESTREL-ETHINYL ESTRADIOL 0.12-0.015 MG/24HR VA RING: VAGINAL_RING | VAGINAL | 4 refills | Status: DC

## 2017-12-11 ENCOUNTER — Encounter: Payer: 59 | Admitting: Internal Medicine

## 2017-12-15 ENCOUNTER — Encounter: Payer: Self-pay | Admitting: Internal Medicine

## 2017-12-15 ENCOUNTER — Ambulatory Visit (INDEPENDENT_AMBULATORY_CARE_PROVIDER_SITE_OTHER): Payer: 59 | Admitting: Internal Medicine

## 2017-12-15 VITALS — BP 124/84 | HR 89 | Temp 98.0°F | Ht 60.0 in | Wt 209.0 lb

## 2017-12-15 DIAGNOSIS — E559 Vitamin D deficiency, unspecified: Secondary | ICD-10-CM

## 2017-12-15 DIAGNOSIS — F339 Major depressive disorder, recurrent, unspecified: Secondary | ICD-10-CM

## 2017-12-15 DIAGNOSIS — G43C1 Periodic headache syndromes in child or adult, intractable: Secondary | ICD-10-CM | POA: Diagnosis not present

## 2017-12-15 DIAGNOSIS — I10 Essential (primary) hypertension: Secondary | ICD-10-CM

## 2017-12-15 DIAGNOSIS — Z0000 Encounter for general adult medical examination without abnormal findings: Secondary | ICD-10-CM

## 2017-12-15 LAB — CBC
HCT: 41 % (ref 36.0–46.0)
Hemoglobin: 13.5 g/dL (ref 12.0–15.0)
MCHC: 33 g/dL (ref 30.0–36.0)
MCV: 81.4 fl (ref 78.0–100.0)
PLATELETS: 257 10*3/uL (ref 150.0–400.0)
RBC: 5.04 Mil/uL (ref 3.87–5.11)
RDW: 12.8 % (ref 11.5–15.5)
WBC: 7.4 10*3/uL (ref 4.0–10.5)

## 2017-12-15 LAB — LIPID PANEL
Cholesterol: 158 mg/dL (ref 0–200)
HDL: 46.9 mg/dL (ref >39.00)
LDL Cholesterol: 80 mg/dL (ref 0–99)
NonHDL: 110.96
Total CHOL/HDL Ratio: 3
Triglycerides: 157 mg/dL — ABNORMAL HIGH (ref 0.0–149.0)
VLDL: 31.4 mg/dL (ref 0.0–40.0)

## 2017-12-15 LAB — COMPREHENSIVE METABOLIC PANEL
ALT: 16 U/L (ref 0–35)
AST: 16 U/L (ref 0–37)
Albumin: 3.8 g/dL (ref 3.5–5.2)
Alkaline Phosphatase: 37 U/L — ABNORMAL LOW (ref 39–117)
BUN: 14 mg/dL (ref 6–23)
CO2: 26 mEq/L (ref 19–32)
Calcium: 9 mg/dL (ref 8.4–10.5)
Chloride: 104 mEq/L (ref 96–112)
Creatinine, Ser: 1.02 mg/dL (ref 0.40–1.20)
GFR: 74.33 mL/min (ref >60.00)
Glucose, Bld: 90 mg/dL (ref 70–99)
Potassium: 3.7 mEq/L (ref 3.5–5.1)
Sodium: 138 mEq/L (ref 135–145)
Total Bilirubin: 0.5 mg/dL (ref 0.2–1.2)
Total Protein: 6.7 g/dL (ref 6.0–8.3)

## 2017-12-15 LAB — VITAMIN D 25 HYDROXY (VIT D DEFICIENCY, FRACTURES): VITD: 16.86 ng/mL — ABNORMAL LOW (ref 30.00–100.00)

## 2017-12-15 MED ORDER — SERTRALINE HCL 50 MG PO TABS: 50.0000 mg | ORAL_TABLET | Freq: Every day | ORAL | 2 refills | Status: DC

## 2017-12-15 MED ORDER — VITAMIN D (ERGOCALCIFEROL) 1.25 MG (50000 UNIT) PO CAPS: 50000.0000 [IU] | ORAL_CAPSULE | ORAL | 0 refills | Status: DC

## 2017-12-15 NOTE — Patient Instructions (Signed)

## 2017-12-15 NOTE — Assessment & Plan Note (Signed)
Controlled on Triamterene HCT CMET today Reinforced DASH diet and exercise for weight loss

## 2017-12-15 NOTE — Assessment & Plan Note (Signed)
Stable on Metoprolol and Zomig Will monitor

## 2017-12-15 NOTE — Assessment & Plan Note (Signed)
Deteriorated Increase Sertraline to 50 mg daily, eRx sent to pharmacy Continue Wellbutrin for now Encouraged therapy, she declines at this time

## 2017-12-15 NOTE — Addendum Note (Signed)
Addended by: Jearld Fenton on: 12/15/2017 04:07 PM   Modules accepted: Orders

## 2017-12-15 NOTE — Progress Notes (Addendum)
Subjective:    Patient ID: Mallory Meyers, female    DOB: Mar 28, 1971, 47 y.o.   MRN: [id number]  HPI  Pt presents to the clinic today for her annual exam. She is also due to follow up chronic conditions.  Depression: This is persistent. She is under a lot of stress at work. She is taking Wellbutrin and Sertraline as prescribed. She denies anxiety, SI/HI.  Frequent Headaches: She reports her headaches have actually improved. She is taking Metoprolol and Zolmitriptan as prescribed.   HTN: Her BP today is 124/84. She is taking Triamterene-HCT as prescribed. ECG from 11/2014 reviewed.  Flu: 02/2016 Tetanus: 08/2014 Pap Smear: 08/2016 Mammogram: 08/2016 Vision Screening: annually Dentist: every 3 months  Diet: She does eat meat. She consumes some fruits and veggies. She is eating fried foods. She drinks mostly water, juice. Exercise: None  Review of Systems      Past Medical History:  Diagnosis Date  . Anemia   . Depression   . Headache   . Hypertension   . Polycystic ovaries   . Seizures ([place])    AS A CHILD    Current Outpatient Medications  Medication Sig Dispense Refill  . buPROPion (WELLBUTRIN XL) 300 MG 24 hr tablet Take 1 tablet (300 mg total) by mouth daily. 90 tablet 3  . cyclobenzaprine (FLEXERIL) 10 MG tablet Take 1 tablet (10 mg total) by mouth at bedtime as needed for muscle spasms. 30 tablet 0  . Diethylpropion HCl 25 MG TABS Take 1 tablet (25 mg total) by mouth daily. 30 each 0  . etonogestrel-ethinyl estradiol (NUVARING) 0.12-0.015 MG/24HR vaginal ring INSERT 1 RING VAGINALLY AS DIRECTED. REMOVE AND REPLACE AFTER 3 WEEKS FOR 3 PLACEMENTS, THEN WAIT 7 DAYS BEFORE INSERTING A NEW RING 3 each 4  . metoprolol succinate (TOPROL-XL) 100 MG 24 hr tablet Take 1 tablet (100 mg total) by mouth daily. MUST SCHEDULE ANNUAL EXAM FOR REFILLS 90 tablet 3  . Multiple Vitamins-Minerals (ONE-A-DAY WOMENS 50 PLUS PO) Take 1 tablet by mouth daily at 12 noon.    . naproxen  (NAPROSYN) 500 MG tablet Take 1 tablet (500 mg total) by mouth 2 (two) times daily with a meal. 180 tablet 3  . naratriptan (AMERGE) 2.5 MG tablet Take 1 tablet (2.5 mg total) by mouth as needed for migraine. Take one (1) tablet at onset of headache; may repeat in 4 hours if needed 18 tablet 3  . polyethylene glycol powder (GLYCOLAX/MIRALAX) powder 255 grams one bottle for colonoscopy prep 255 g 0  . sertraline (ZOLOFT) 25 MG tablet Take 1 tablet (25 mg total) by mouth daily. MUST SCHEDULE ANNUAL PHYSICAL 90 tablet 3  . triamterene-hydrochlorothiazide (MAXZIDE-25) 37.5-25 MG tablet Take 1 tablet by mouth daily. 90 tablet 3  . zolmitriptan (ZOMIG) 5 MG tablet Take 1 tablet (5 mg total) by mouth once as needed. May repeat in two hours as needed 18 tablet 3  . zolmitriptan (ZOMIG) 5 MG tablet TAKE 1 TABLET (5 MG TOTAL) BY MOUTH ONCE AS NEEDED. MAY REPEAT IN TWO HOURS AS NEEDED 12 tablet 1   No current facility-administered medications for this visit.     No Known Allergies  Family History  Problem Relation Age of Onset  . Lung disease Mother   . Hypertension Mother   . Kidney disease Mother   . Diabetes Mother   . Colon polyps Mother        colon resection  . Hypertension Maternal Grandmother   . Breast cancer  Neg Hx   . Colon cancer Neg Hx     Social History   Socioeconomic History  . Marital status: Legally Separated    Spouse name: Not on file  . Number of children: Not on file  . Years of education: Not on file  . Highest education level: Not on file  Occupational History  . Not on file  Social Needs  . Financial resource strain: Not on file  . Food insecurity:    Worry: Not on file    Inability: Not on file  . Transportation needs:    Medical: Not on file    Non-medical: Not on file  Tobacco Use  . Smoking status: Never Smoker  . Smokeless tobacco: Never Used  Substance and Sexual Activity  . Alcohol use: Yes    Alcohol/week: 0.0 standard drinks    Comment:  occasional  . Drug use: No  . Sexual activity: Yes    Birth control/protection: Inserts  Lifestyle  . Physical activity:    Days per week: Not on file    Minutes per session: Not on file  . Stress: Not on file  Relationships  . Social connections:    Talks on phone: Not on file    Gets together: Not on file    Attends religious service: Not on file    Active member of club or organization: Not on file    Attends meetings of clubs or organizations: Not on file    Relationship status: Not on file  . Intimate partner violence:    Fear of current or ex partner: Not on file    Emotionally abused: Not on file    Physically abused: Not on file    Forced sexual activity: Not on file  Other Topics Concern  . Not on file  Social History Narrative  . Not on file     Constitutional: Pt reports intermittent headaches. Denies fever, malaise, fatigue, or abrupt weight changes.  HEENT: Denies eye pain, eye redness, ear pain, ringing in the ears, wax buildup, runny nose, nasal congestion, bloody nose, or sore throat. Respiratory: Denies difficulty breathing, shortness of breath, cough or sputum production.   Cardiovascular: Denies chest pain, chest tightness, palpitations or swelling in the hands or feet.  Gastrointestinal: Denies abdominal pain, bloating, constipation, diarrhea or blood in the stool.  GU: Denies urgency, frequency, pain with urination, burning sensation, blood in urine, odor or discharge. Musculoskeletal: Denies decrease in range of motion, difficulty with gait, muscle pain or joint pain and swelling.  Skin: Denies redness, rashes, lesions or ulcercations.  Neurological: Denies dizziness, difficulty with memory, difficulty with speech or problems with balance and coordination.  Psych: Pt reports depression. Denies anxiety, SI/HI.  No other specific complaints in a complete review of systems (except as listed in HPI above).  Objective:   Physical Exam  BP 124/84   Pulse  89   Temp 98 F (36.7 C) (Oral)   Ht 5' (1.524 m)   Wt 209 lb (94.8 kg)   SpO2 98%   BMI 40.82 kg/m  Wt Readings from Last 3 Encounters:  12/15/17 209 lb (94.8 kg)  11/20/17 212 lb (96.2 kg)  09/27/17 205 lb (93 kg)    General: Appears her stated age, obese in NAD. Skin: Warm, dry and intact.  HEENT: Head: normal shape and size; Eyes: sclera white, no icterus, conjunctiva pink, PERRLA and EOMs intact; Ears: Tm's gray and intact, normal light reflex;Throat/Mouth: Teeth present, mucosa pink and moist,  no exudate, lesions or ulcerations noted.  Neck:  Neck supple, trachea midline. No masses, lumps or thyromegaly present.  Cardiovascular: Normal rate and rhythm. S1,S2 noted.  No murmur, rubs or gallops noted. No JVD or BLE edema.  Pulmonary/Chest: Normal effort and positive vesicular breath sounds. No respiratory distress. No wheezes, rales or ronchi noted.  Abdomen: Soft and nontender. Normal bowel sounds. No distention or masses noted. Liver, spleen and kidneys non palpable. Musculoskeletal: Strength 5/5 BUE/BLE. No difficulty with gait.  Neurological: Alert and oriented. Cranial nerves II-XII grossly intact. Coordination normal.  Psychiatric: Mood and affect normal. Behavior is normal. Judgment and thought content normal.     BMET    Component Value Date/Time   NA 138 07/09/2016 1116   NA 136 06/16/2015 1442   NA 137 08/15/2012 0949   K 3.7 07/09/2016 1116   K 3.8 08/15/2012 0949   CL 102 07/09/2016 1116   CL 104 08/15/2012 0949   CO2 29 07/09/2016 1116   CO2 26 08/15/2012 0949   GLUCOSE 134 (H) 07/09/2016 1116   GLUCOSE 92 08/15/2012 0949   BUN 19 07/09/2016 1116   BUN 15 06/16/2015 1442   BUN 16 08/15/2012 0949   CREATININE 1.07 07/09/2016 1116   CREATININE 0.89 08/15/2012 0949   CALCIUM 9.9 07/09/2016 1116   CALCIUM 9.0 08/15/2012 0949   GFRNONAA 74 06/16/2015 1442   GFRNONAA >60 08/15/2012 0949   GFRAA 85 06/16/2015 1442   GFRAA >60 08/15/2012 0949    Lipid  Panel     Component Value Date/Time   CHOL 155 09/16/2014   TRIG 117 09/16/2014   HDL 56 09/16/2014   HDL 36 09/16/2014   LDLCALC 96 09/16/2014    CBC    Component Value Date/Time   WBC 5.8 07/09/2016 1116   RBC 5.05 07/09/2016 1116   HGB 13.7 07/09/2016 1116   HGB 13.6 06/16/2015 1442   HCT 41.2 07/09/2016 1116   HCT 40.4 06/16/2015 1442   PLT 278.0 07/09/2016 1116   PLT 302 06/16/2015 1442   MCV 81.7 07/09/2016 1116   MCV 82 06/16/2015 1442   MCV 82 08/15/2012 0949   MCH 27.5 06/16/2015 1442   MCH 27.4 12/22/2014 1052   MCHC 33.3 07/09/2016 1116   RDW 12.8 07/09/2016 1116   RDW 13.0 06/16/2015 1442   RDW 12.7 08/15/2012 0949   LYMPHSABS 2.0 12/22/2014 1052   MONOABS 0.4 12/22/2014 1052   EOSABS 0.2 12/22/2014 1052   BASOSABS 0.1 12/22/2014 1052    Hgb A1C Lab Results  Component Value Date   HGBA1C 5.8 07/09/2016            Assessment & Plan:   Preventative Health Maintenance:  Encouraged her to get a flu shot in the fall Tetanus UTD Pap smear UTD She will call to schedule her mammogram Encouraged her to consume a balanced diet and exercise regimen Advised her to see an eye doctor and dentist annually Will check CBC, CMET, Lipid and Vit D today  RTC in 1 year, sooner if needed Webb Silversmith, NP

## 2017-12-18 ENCOUNTER — Ambulatory Visit: Payer: 59 | Admitting: Obstetrics & Gynecology

## 2018-01-05 ENCOUNTER — Ambulatory Visit: Payer: 59 | Admitting: Obstetrics & Gynecology

## 2018-01-07 ENCOUNTER — Ambulatory Visit: Payer: 59 | Admitting: Obstetrics & Gynecology

## 2018-01-09 ENCOUNTER — Other Ambulatory Visit: Payer: Self-pay | Admitting: Internal Medicine

## 2018-03-09 ENCOUNTER — Ambulatory Visit (INDEPENDENT_AMBULATORY_CARE_PROVIDER_SITE_OTHER): Payer: 59 | Admitting: Internal Medicine

## 2018-03-09 ENCOUNTER — Encounter: Payer: Self-pay | Admitting: Internal Medicine

## 2018-03-09 ENCOUNTER — Other Ambulatory Visit: Payer: Self-pay | Admitting: Internal Medicine

## 2018-03-09 VITALS — BP 128/82 | HR 76 | Temp 98.6°F | Wt 209.0 lb

## 2018-03-09 DIAGNOSIS — F419 Anxiety disorder, unspecified: Secondary | ICD-10-CM

## 2018-03-09 DIAGNOSIS — F329 Major depressive disorder, single episode, unspecified: Secondary | ICD-10-CM

## 2018-03-09 DIAGNOSIS — F5104 Psychophysiologic insomnia: Secondary | ICD-10-CM

## 2018-03-09 MED ORDER — HYDROXYZINE HCL 10 MG PO TABS: 10.0000 mg | ORAL_TABLET | Freq: Every day | ORAL | 0 refills | Status: DC | PRN

## 2018-03-09 MED ORDER — ESZOPICLONE 1 MG PO TABS: 1.0000 mg | ORAL_TABLET | Freq: Every evening | ORAL | 0 refills | Status: DC | PRN

## 2018-03-09 NOTE — Progress Notes (Signed)
Subjective:    Patient ID: Mallory Meyers, female    DOB: Jan 03, 1971, 47 y.o.   MRN: [id number]  HPI  Patient presents to clinic today for follow-up depression.  She reports she is going through a break-up of a bad relationship.  She reports her lease is not up and have to live together until that time.  She is feeling down, depressed, anxious and on edge.  She has no issues with her appetite.  She is having difficulty falling asleep and staying asleep.  She is taking Wellbutrin and Sertraline as prescribed.  She is also tried Melatonin and Advil PM one 1 week.  She has been seeing a Engineer, maintenance at work.  She denies SI/HI.    Review of Systems  Past Medical History:  Diagnosis Date  . Anemia   . Depression   . Headache   . Hypertension   . Polycystic ovaries   . Seizures ([place])    AS A CHILD    Current Outpatient Medications  Medication Sig Dispense Refill  . buPROPion (WELLBUTRIN XL) 300 MG 24 hr tablet Take 1 tablet (300 mg total) by mouth daily. 90 tablet 3  . Diethylpropion HCl 25 MG TABS Take 1 tablet (25 mg total) by mouth daily. 30 each 0  . etonogestrel-ethinyl estradiol (NUVARING) 0.12-0.015 MG/24HR vaginal ring INSERT 1 RING VAGINALLY AS DIRECTED. REMOVE AND REPLACE AFTER 3 WEEKS FOR 3 PLACEMENTS, THEN WAIT 7 DAYS BEFORE INSERTING A NEW RING 3 each 4  . metoprolol succinate (TOPROL-XL) 100 MG 24 hr tablet Take 1 tablet (100 mg total) by mouth daily. MUST SCHEDULE ANNUAL EXAM FOR REFILLS 90 tablet 3  . Multiple Vitamins-Minerals (ONE-A-DAY WOMENS 50 PLUS PO) Take 1 tablet by mouth daily at 12 noon.    . naproxen (NAPROSYN) 500 MG tablet Take 1 tablet (500 mg total) by mouth 2 (two) times daily with a meal. 180 tablet 3  . sertraline (ZOLOFT) 50 MG tablet TAKE 1 TABLET BY MOUTH EVERY DAY 90 tablet 1  . triamterene-hydrochlorothiazide (MAXZIDE-25) 37.5-25 MG tablet Take 1 tablet by mouth daily. 90 tablet 3  . Vitamin D, Ergocalciferol, (DRISDOL) 50000 units CAPS capsule Take  1 capsule (50,000 Units total) by mouth every 7 (seven) days. 12 capsule 0  . zolmitriptan (ZOMIG) 5 MG tablet TAKE 1 TABLET (5 MG TOTAL) BY MOUTH ONCE AS NEEDED. MAY REPEAT IN TWO HOURS AS NEEDED 12 tablet 1  . eszopiclone (LUNESTA) 1 MG TABS tablet Take 1 tablet (1 mg total) by mouth at bedtime as needed for sleep. Take immediately before bedtime 30 tablet 0  . hydrOXYzine (ATARAX/VISTARIL) 10 MG tablet Take 1 tablet (10 mg total) by mouth daily as needed. 30 tablet 0   No current facility-administered medications for this visit.     No Known Allergies  Family History  Problem Relation Age of Onset  . Lung disease Mother   . Hypertension Mother   . Kidney disease Mother   . Diabetes Mother   . Colon polyps Mother        colon resection  . Hypertension Maternal Grandmother   . Breast cancer Neg Hx   . Colon cancer Neg Hx     Social History   Socioeconomic History  . Marital status: Legally Separated    Spouse name: Not on file  . Number of children: Not on file  . Years of education: Not on file  . Highest education level: Not on file  Occupational History  .  Not on file  Social Needs  . Financial resource strain: Not on file  . Food insecurity:    Worry: Not on file    Inability: Not on file  . Transportation needs:    Medical: Not on file    Non-medical: Not on file  Tobacco Use  . Smoking status: Never Smoker  . Smokeless tobacco: Never Used  Substance and Sexual Activity  . Alcohol use: Yes    Alcohol/week: 0.0 standard drinks    Comment: occasional  . Drug use: No  . Sexual activity: Yes    Birth control/protection: Inserts  Lifestyle  . Physical activity:    Days per week: Not on file    Minutes per session: Not on file  . Stress: Not on file  Relationships  . Social connections:    Talks on phone: Not on file    Gets together: Not on file    Attends religious service: Not on file    Active member of club or organization: Not on file    Attends  meetings of clubs or organizations: Not on file    Relationship status: Not on file  . Intimate partner violence:    Fear of current or ex partner: Not on file    Emotionally abused: Not on file    Physically abused: Not on file    Forced sexual activity: Not on file  Other Topics Concern  . Not on file  Social History Narrative  . Not on file     Constitutional: Denies fever, malaise, fatigue, headache or abrupt weight changes.  Neurological: Patient reports insomnia.  Denies dizziness, difficulty with memory, difficulty with speech or problems with balance and coordination.  Psych: Patient reports anxiety, depression.  Denies SI/HI.  No other specific complaints in a complete review of systems (except as listed in HPI above).     Objective:   Physical Exam  BP 128/82   Pulse 76   Temp 98.6 F (37 C) (Oral)   Wt 209 lb (94.8 kg)   SpO2 98%   BMI 40.82 kg/m  Wt Readings from Last 3 Encounters:  03/09/18 209 lb (94.8 kg)  12/15/17 209 lb (94.8 kg)  11/20/17 212 lb (96.2 kg)    General: Appears her stated age, obese, in NAD. Neurological: Alert and oriented.  Psychiatric: Tearful. behavior is normal. Judgment and thought content normal.     BMET    Component Value Date/Time   NA 138 12/15/2017 1234   NA 136 06/16/2015 1442   NA 137 08/15/2012 0949   K 3.7 12/15/2017 1234   K 3.8 08/15/2012 0949   CL 104 12/15/2017 1234   CL 104 08/15/2012 0949   CO2 26 12/15/2017 1234   CO2 26 08/15/2012 0949   GLUCOSE 90 12/15/2017 1234   GLUCOSE 92 08/15/2012 0949   BUN 14 12/15/2017 1234   BUN 15 06/16/2015 1442   BUN 16 08/15/2012 0949   CREATININE 1.02 12/15/2017 1234   CREATININE 0.89 08/15/2012 0949   CALCIUM 9.0 12/15/2017 1234   CALCIUM 9.0 08/15/2012 0949   GFRNONAA 74 06/16/2015 1442   GFRNONAA >60 08/15/2012 0949   GFRAA 85 06/16/2015 1442   GFRAA >60 08/15/2012 0949    Lipid Panel     Component Value Date/Time   CHOL 158 12/15/2017 1234   TRIG  157.0 (H) 12/15/2017 1234   HDL 46.90 12/15/2017 1234   CHOLHDL 3 12/15/2017 1234   VLDL 31.4 12/15/2017 1234   LDLCALC 80  12/15/2017 1234    CBC    Component Value Date/Time   WBC 7.4 12/15/2017 1234   RBC 5.04 12/15/2017 1234   HGB 13.5 12/15/2017 1234   HGB 13.6 06/16/2015 1442   HCT 41.0 12/15/2017 1234   HCT 40.4 06/16/2015 1442   PLT 257.0 12/15/2017 1234   PLT 302 06/16/2015 1442   MCV 81.4 12/15/2017 1234   MCV 82 06/16/2015 1442   MCV 82 08/15/2012 0949   MCH 27.5 06/16/2015 1442   MCH 27.4 12/22/2014 1052   MCHC 33.0 12/15/2017 1234   RDW 12.8 12/15/2017 1234   RDW 13.0 06/16/2015 1442   RDW 12.7 08/15/2012 0949   LYMPHSABS 2.0 12/22/2014 1052   MONOABS 0.4 12/22/2014 1052   EOSABS 0.2 12/22/2014 1052   BASOSABS 0.1 12/22/2014 1052    Hgb A1C Lab Results  Component Value Date   HGBA1C 5.8 07/09/2016           Assessment & Plan:

## 2018-03-10 ENCOUNTER — Encounter: Payer: Self-pay | Admitting: Internal Medicine

## 2018-03-10 DIAGNOSIS — G4700 Insomnia, unspecified: Secondary | ICD-10-CM | POA: Insufficient documentation

## 2018-03-10 DIAGNOSIS — F419 Anxiety disorder, unspecified: Principal | ICD-10-CM

## 2018-03-10 DIAGNOSIS — F329 Major depressive disorder, single episode, unspecified: Secondary | ICD-10-CM | POA: Insufficient documentation

## 2018-03-10 DIAGNOSIS — F99 Mental disorder, not otherwise specified: Secondary | ICD-10-CM | POA: Insufficient documentation

## 2018-03-10 NOTE — Assessment & Plan Note (Addendum)
Deteriorated Support offered today Increase Sertraline to 100 mg daily, she will take 2 50 mg tablets at this time and let me know how that is working for her.  If working well we will send in new Rx Continue Wellbutrin E Rx for hydroxyzine 10 mg daily as needed for anxiety-sedation caution given Referral to psychology placed

## 2018-03-10 NOTE — Patient Instructions (Signed)

## 2018-03-10 NOTE — Assessment & Plan Note (Signed)
We will trial Lunesta, she has taken from the past with good results

## 2018-03-24 ENCOUNTER — Inpatient Hospital Stay: Admission: RE | Admit: 2018-03-24 | Payer: 59 | Source: Ambulatory Visit

## 2018-03-31 ENCOUNTER — Other Ambulatory Visit: Payer: Self-pay | Admitting: Internal Medicine

## 2018-03-31 ENCOUNTER — Telehealth: Payer: Self-pay

## 2018-03-31 DIAGNOSIS — F419 Anxiety disorder, unspecified: Principal | ICD-10-CM

## 2018-03-31 DIAGNOSIS — F329 Major depressive disorder, single episode, unspecified: Secondary | ICD-10-CM

## 2018-03-31 NOTE — Telephone Encounter (Signed)
-----   Message from Gae Dry, MD sent at 02/25/2018  5:54 AM EDT ----- Regarding: MMG Received notice she has not received MMG yet as ordered at her Annual. Please check and encourage her to do this, and document conversation.

## 2018-03-31 NOTE — Telephone Encounter (Signed)
Pt states she will call and reschedule her mammo, she had to work and missed the appointment

## 2018-04-06 ENCOUNTER — Other Ambulatory Visit: Payer: Self-pay | Admitting: Obstetrics & Gynecology

## 2018-04-06 DIAGNOSIS — Z1231 Encounter for screening mammogram for malignant neoplasm of breast: Secondary | ICD-10-CM

## 2018-04-16 ENCOUNTER — Ambulatory Visit: Payer: 59 | Admitting: Internal Medicine

## 2018-04-16 ENCOUNTER — Encounter: Payer: Self-pay | Admitting: Internal Medicine

## 2018-04-16 VITALS — BP 124/84 | HR 76 | Temp 98.1°F | Wt 206.0 lb

## 2018-04-16 DIAGNOSIS — F329 Major depressive disorder, single episode, unspecified: Secondary | ICD-10-CM | POA: Diagnosis not present

## 2018-04-16 DIAGNOSIS — F419 Anxiety disorder, unspecified: Secondary | ICD-10-CM | POA: Diagnosis not present

## 2018-04-16 DIAGNOSIS — L304 Erythema intertrigo: Secondary | ICD-10-CM | POA: Diagnosis not present

## 2018-04-16 DIAGNOSIS — F32A Depression, unspecified: Secondary | ICD-10-CM

## 2018-04-16 MED ORDER — SERTRALINE HCL 100 MG PO TABS: 100.0000 mg | ORAL_TABLET | Freq: Every day | ORAL | 1 refills | Status: DC

## 2018-04-16 MED ORDER — KETOCONAZOLE 2 % EX CREA: 1.0000 "application " | TOPICAL_CREAM | Freq: Every day | CUTANEOUS | 0 refills | Status: DC

## 2018-04-16 NOTE — Progress Notes (Signed)
Subjective:    Patient ID: Mallory Meyers, female    DOB: 12-29-1970, 47 y.o.   MRN: [id number]  HPI  Pt presents to the clinic today with c/o a rash under her breast. She noticed this 3 weeks ago. The rash was very red, but has improved. The skin is now peeling in that area. The rash has not spread. She denies changes in soaps, lotions or detergents. She has tried Hydrocortisone without any relief. No on in her home has a similar rash. The rash is almost resolved but she just wanted to have it checked.  She also needs a refill of Sertraline. She reports the 100 mg is providing much better symptom control.  Review of Systems      Past Medical History:  Diagnosis Date  . Anemia   . Depression   . Headache   . Hypertension   . Polycystic ovaries   . Seizures ([place])    AS A CHILD    Current Outpatient Medications  Medication Sig Dispense Refill  . buPROPion (WELLBUTRIN XL) 300 MG 24 hr tablet Take 1 tablet (300 mg total) by mouth daily. 90 tablet 3  . Diethylpropion HCl 25 MG TABS Take 1 tablet (25 mg total) by mouth daily. 30 each 0  . eszopiclone (LUNESTA) 1 MG TABS tablet Take 1 tablet (1 mg total) by mouth at bedtime as needed for sleep. Take immediately before bedtime 30 tablet 0  . etonogestrel-ethinyl estradiol (NUVARING) 0.12-0.015 MG/24HR vaginal ring INSERT 1 RING VAGINALLY AS DIRECTED. REMOVE AND REPLACE AFTER 3 WEEKS FOR 3 PLACEMENTS, THEN WAIT 7 DAYS BEFORE INSERTING A NEW RING 3 each 4  . hydrOXYzine (ATARAX/VISTARIL) 10 MG tablet Take 1 tablet (10 mg total) by mouth daily as needed. 30 tablet 0  . metoprolol succinate (TOPROL-XL) 100 MG 24 hr tablet Take 1 tablet (100 mg total) by mouth daily. MUST SCHEDULE ANNUAL EXAM FOR REFILLS 90 tablet 3  . Multiple Vitamins-Minerals (ONE-A-DAY WOMENS 50 PLUS PO) Take 1 tablet by mouth daily at 12 noon.    . naproxen (NAPROSYN) 500 MG tablet Take 1 tablet (500 mg total) by mouth 2 (two) times daily with a meal. 180 tablet 3  .  sertraline (ZOLOFT) 50 MG tablet TAKE 1 TABLET BY MOUTH EVERY DAY 90 tablet 1  . triamterene-hydrochlorothiazide (MAXZIDE-25) 37.5-25 MG tablet Take 1 tablet by mouth daily. 90 tablet 3  . Vitamin D, Ergocalciferol, (DRISDOL) 50000 units CAPS capsule Take 1 capsule (50,000 Units total) by mouth every 7 (seven) days. 12 capsule 0  . zolmitriptan (ZOMIG) 5 MG tablet TAKE 1 TABLET (5 MG TOTAL) BY MOUTH ONCE AS NEEDED. MAY REPEAT IN TWO HOURS AS NEEDED 12 tablet 1   No current facility-administered medications for this visit.     No Known Allergies  Family History  Problem Relation Age of Onset  . Lung disease Mother   . Hypertension Mother   . Kidney disease Mother   . Diabetes Mother   . Colon polyps Mother        colon resection  . Hypertension Maternal Grandmother   . Breast cancer Neg Hx   . Colon cancer Neg Hx     Social History   Socioeconomic History  . Marital status: Legally Separated    Spouse name: Not on file  . Number of children: Not on file  . Years of education: Not on file  . Highest education level: Not on file  Occupational History  . Not on  file  Social Needs  . Financial resource strain: Not on file  . Food insecurity:    Worry: Not on file    Inability: Not on file  . Transportation needs:    Medical: Not on file    Non-medical: Not on file  Tobacco Use  . Smoking status: Never Smoker  . Smokeless tobacco: Never Used  Substance and Sexual Activity  . Alcohol use: Yes    Alcohol/week: 0.0 standard drinks    Comment: occasional  . Drug use: No  . Sexual activity: Yes    Birth control/protection: Inserts  Lifestyle  . Physical activity:    Days per week: Not on file    Minutes per session: Not on file  . Stress: Not on file  Relationships  . Social connections:    Talks on phone: Not on file    Gets together: Not on file    Attends religious service: Not on file    Active member of club or organization: Not on file    Attends meetings of  clubs or organizations: Not on file    Relationship status: Not on file  . Intimate partner violence:    Fear of current or ex partner: Not on file    Emotionally abused: Not on file    Physically abused: Not on file    Forced sexual activity: Not on file  Other Topics Concern  . Not on file  Social History Narrative  . Not on file     Constitutional: Denies fever, malaise, fatigue, headache or abrupt weight changes.  Skin: Pt reports rash under breast. Denies ulcercations.    No other specific complaints in a complete review of systems (except as listed in HPI above).  Objective:   Physical Exam   BP 124/84   Pulse 76   Temp 98.1 F (36.7 C) (Oral)   Wt 206 lb (93.4 kg)   SpO2 97%   BMI 40.23 kg/m  Wt Readings from Last 3 Encounters:  04/16/18 206 lb (93.4 kg)  03/09/18 209 lb (94.8 kg)  12/15/17 209 lb (94.8 kg)    General: Appears her stated age, well developed, well nourished in NAD. Skin: Hyperpigment rash with scaly borders, central clearing and peeling of skin noted under bilateral breasts.  BMET    Component Value Date/Time   NA 138 12/15/2017 1234   NA 136 06/16/2015 1442   NA 137 08/15/2012 0949   K 3.7 12/15/2017 1234   K 3.8 08/15/2012 0949   CL 104 12/15/2017 1234   CL 104 08/15/2012 0949   CO2 26 12/15/2017 1234   CO2 26 08/15/2012 0949   GLUCOSE 90 12/15/2017 1234   GLUCOSE 92 08/15/2012 0949   BUN 14 12/15/2017 1234   BUN 15 06/16/2015 1442   BUN 16 08/15/2012 0949   CREATININE 1.02 12/15/2017 1234   CREATININE 0.89 08/15/2012 0949   CALCIUM 9.0 12/15/2017 1234   CALCIUM 9.0 08/15/2012 0949   GFRNONAA 74 06/16/2015 1442   GFRNONAA >60 08/15/2012 0949   GFRAA 85 06/16/2015 1442   GFRAA >60 08/15/2012 0949    Lipid Panel     Component Value Date/Time   CHOL 158 12/15/2017 1234   TRIG 157.0 (H) 12/15/2017 1234   HDL 46.90 12/15/2017 1234   CHOLHDL 3 12/15/2017 1234   VLDL 31.4 12/15/2017 1234   LDLCALC 80 12/15/2017 1234     CBC    Component Value Date/Time   WBC 7.4 12/15/2017 1234   RBC  5.04 12/15/2017 1234   HGB 13.5 12/15/2017 1234   HGB 13.6 06/16/2015 1442   HCT 41.0 12/15/2017 1234   HCT 40.4 06/16/2015 1442   PLT 257.0 12/15/2017 1234   PLT 302 06/16/2015 1442   MCV 81.4 12/15/2017 1234   MCV 82 06/16/2015 1442   MCV 82 08/15/2012 0949   MCH 27.5 06/16/2015 1442   MCH 27.4 12/22/2014 1052   MCHC 33.0 12/15/2017 1234   RDW 12.8 12/15/2017 1234   RDW 13.0 06/16/2015 1442   RDW 12.7 08/15/2012 0949   LYMPHSABS 2.0 12/22/2014 1052   MONOABS 0.4 12/22/2014 1052   EOSABS 0.2 12/22/2014 1052   BASOSABS 0.1 12/22/2014 1052    Hgb A1C Lab Results  Component Value Date   HGBA1C 5.8 07/09/2016           Assessment & Plan:   Intertrigo:  RX for Ketaconazole cream BID Keep area dry Avoid rubbing the area, pat dry  Return precautions discussed Webb Silversmith, NP

## 2018-04-16 NOTE — Patient Instructions (Signed)
Intertrigo  Intertrigo is skin irritation (inflammation) that happens in warm, moist areas of the body. The irritation can cause a rash and make skin raw and itchy. The rash is usually pink or red. It happens mostly between folds of skin or where skin rubs together, such as:   Between the toes.   In the armpits.   In the groin area.   Under the belly.   Under the breasts.   Around the butt area.  This condition is not passed from person to person (is not contagious).  What are the causes?   Heat, moisture, rubbing, and not enough air movement.   The condition can be made worse by:  ? Sweat.  ? Bacteria.  ? A fungus, such as yeast.  What increases the risk?   Moisture in your skin folds.   You are more likely to develop this condition if you:  ? Have diabetes.  ? Are overweight.  ? Are not able to move around.  ? Live in a warm and moist climate.  ? Wear splints, braces, or other medical devices.  ? Are not able to control your pee (urine) or poop (stool).  What are the signs or symptoms?   A pink or red skin rash in the skin fold or near the skin fold.   Raw or scaly skin.   Itching.   A burning feeling.   Bleeding.   Leaking fluid.   A bad smell.  How is this treated?   Cleaning and drying your skin.   Taking an antibiotic medicine or using an antibiotic skin cream for a bacterial infection.   Using an antifungal cream on your skin or taking pills for an infection that was caused by a fungus, such as yeast.   Using a steroid ointment to stop the itching and irritation.   Separating the skin fold with a clean cotton cloth to absorb moisture and allow air to flow into the area.  Follow these instructions at home:   Keep the affected area clean and dry.   Do not scratch your skin.   Stay cool as much as you can. Use an air conditioner or a fan, if you have one.   Apply over-the-counter and prescription medicines only as told by your doctor.   If you were prescribed an antibiotic medicine,  use it as told by your doctor. Do not stop using the antibiotic even if your condition starts to get better.   Keep all follow-up visits as told by your doctor. This is important.  How is this prevented?     Stay at a healthy weight.   Take care of your feet. This is very important if you have diabetes. You should:  ? Wear shoes that fit well.  ? Keep your feet dry.  ? Wear clean cotton or wool socks.   Protect the skin in your groin and butt area as told by your doctor. To do this:  ? Follow a regular cleaning routine.  ? Use creams, powders, or ointments that protect your skin.  ? Change protection pads often.   Do not wear tight clothes. Wear clothes that:  ? Are loose.  ? Take moisture away from your body.  ? Are made of cotton.   Wear a bra that gives good support, if needed.   Shower and dry yourself well after being active. Use a hair dryer on a cool setting to dry between skin folds.   Keep your blood   sugar under control if you have diabetes.  Contact a doctor if:   Your symptoms do not get better with treatment.   Your symptoms get worse or they spread.   You notice more redness and warmth.   You have a fever.  Summary   Intertrigo is skin irritation that occurs when folds of skin rub together.   This condition is caused by heat, moisture, and rubbing.   This condition may be treated by cleaning and drying your skin and with medicines.   Apply over-the-counter and prescription medicines only as told by your doctor.   Keep all follow-up visits as told by your doctor. This is important.  This information is not intended to replace advice given to you by your health care provider. Make sure you discuss any questions you have with your health care provider.  Document Released: 05/18/2010 Document Revised: 09/15/2017 Document Reviewed: 10/17/2014  [employer]vier Interactive Patient Education  2019 [employer]

## 2018-04-16 NOTE — Assessment & Plan Note (Signed)
Improved with higher dose of Sertraline Refilled today

## 2018-04-29 DIAGNOSIS — C50919 Malignant neoplasm of unspecified site of unspecified female breast: Secondary | ICD-10-CM

## 2018-04-29 HISTORY — DX: Malignant neoplasm of unspecified site of unspecified female breast: C50.919

## 2018-04-30 ENCOUNTER — Other Ambulatory Visit: Payer: Self-pay | Admitting: Internal Medicine

## 2018-04-30 DIAGNOSIS — F39 Unspecified mood [affective] disorder: Secondary | ICD-10-CM

## 2018-05-05 ENCOUNTER — Other Ambulatory Visit: Payer: Self-pay | Admitting: Obstetrics & Gynecology

## 2018-05-05 ENCOUNTER — Ambulatory Visit
Admission: RE | Admit: 2018-05-05 | Discharge: 2018-05-05 | Disposition: A | Discharge status: Home / Self Care | Payer: 59 | Source: Ambulatory Visit | Attending: Obstetrics & Gynecology | Admitting: Obstetrics & Gynecology

## 2018-05-05 DIAGNOSIS — N6310 Unspecified lump in the right breast, unspecified quadrant: Secondary | ICD-10-CM

## 2018-05-05 DIAGNOSIS — R928 Other abnormal and inconclusive findings on diagnostic imaging of breast: Secondary | ICD-10-CM

## 2018-05-05 DIAGNOSIS — N6489 Other specified disorders of breast: Secondary | ICD-10-CM

## 2018-05-05 DIAGNOSIS — Z1231 Encounter for screening mammogram for malignant neoplasm of breast: Secondary | ICD-10-CM | POA: Insufficient documentation

## 2018-05-07 ENCOUNTER — Ambulatory Visit
Admission: RE | Admit: 2018-05-07 | Discharge: 2018-05-07 | Disposition: A | Discharge status: Home / Self Care | Payer: 59 | Source: Ambulatory Visit | Attending: Obstetrics & Gynecology | Admitting: Obstetrics & Gynecology

## 2018-05-07 ENCOUNTER — Other Ambulatory Visit: Payer: Self-pay | Admitting: Obstetrics & Gynecology

## 2018-05-07 DIAGNOSIS — N6489 Other specified disorders of breast: Secondary | ICD-10-CM | POA: Diagnosis present

## 2018-05-07 DIAGNOSIS — R928 Other abnormal and inconclusive findings on diagnostic imaging of breast: Secondary | ICD-10-CM | POA: Diagnosis present

## 2018-05-07 DIAGNOSIS — R590 Localized enlarged lymph nodes: Secondary | ICD-10-CM

## 2018-05-07 DIAGNOSIS — N6310 Unspecified lump in the right breast, unspecified quadrant: Secondary | ICD-10-CM | POA: Insufficient documentation

## 2018-05-07 DIAGNOSIS — N6320 Unspecified lump in the left breast, unspecified quadrant: Secondary | ICD-10-CM

## 2018-05-08 ENCOUNTER — Other Ambulatory Visit: Payer: Self-pay | Admitting: Obstetrics & Gynecology

## 2018-05-08 DIAGNOSIS — R928 Other abnormal and inconclusive findings on diagnostic imaging of breast: Secondary | ICD-10-CM

## 2018-05-08 NOTE — Progress Notes (Signed)
Needs GEN SURGERY APPT today or Monday please

## 2018-05-11 ENCOUNTER — Ambulatory Visit (INDEPENDENT_AMBULATORY_CARE_PROVIDER_SITE_OTHER): Payer: 59 | Admitting: Surgery

## 2018-05-11 ENCOUNTER — Other Ambulatory Visit: Payer: Self-pay

## 2018-05-11 ENCOUNTER — Encounter: Payer: Self-pay | Admitting: Surgery

## 2018-05-11 VITALS — BP 130/74 | HR 76 | Temp 97.9°F | Ht 60.0 in | Wt 200.0 lb

## 2018-05-11 DIAGNOSIS — N6310 Unspecified lump in the right breast, unspecified quadrant: Secondary | ICD-10-CM | POA: Diagnosis not present

## 2018-05-11 DIAGNOSIS — N63 Unspecified lump in unspecified breast: Secondary | ICD-10-CM

## 2018-05-11 MED ORDER — ALPRAZOLAM 0.5 MG PO TABS: 0.5000 mg | ORAL_TABLET | Freq: Two times a day (BID) | ORAL | 0 refills | Status: DC | PRN

## 2018-05-11 NOTE — Progress Notes (Signed)
Mallory Meyers

## 2018-05-13 ENCOUNTER — Encounter: Payer: Self-pay | Admitting: Surgery

## 2018-05-13 ENCOUNTER — Ambulatory Visit
Admission: RE | Admit: 2018-05-13 | Discharge: 2018-05-13 | Disposition: A | Discharge status: Home / Self Care | Payer: No Typology Code available for payment source | Source: Ambulatory Visit | Attending: Obstetrics & Gynecology | Admitting: Obstetrics & Gynecology

## 2018-05-13 DIAGNOSIS — N6310 Unspecified lump in the right breast, unspecified quadrant: Secondary | ICD-10-CM | POA: Insufficient documentation

## 2018-05-13 DIAGNOSIS — R590 Localized enlarged lymph nodes: Secondary | ICD-10-CM | POA: Diagnosis present

## 2018-05-13 DIAGNOSIS — R928 Other abnormal and inconclusive findings on diagnostic imaging of breast: Secondary | ICD-10-CM

## 2018-05-13 DIAGNOSIS — N6320 Unspecified lump in the left breast, unspecified quadrant: Secondary | ICD-10-CM

## 2018-05-13 NOTE — Progress Notes (Signed)
Patient ID: Mallory Meyers, female   DOB: 1971/03/24, 48 y.o.   MRN: [id number]  HPI Mallory Meyers is a 48 y.o. female seen in consultation at the request of Dr. Kenton Kingfisher for abnormal mammogram.  Screening mammogram and an abnormality was found.  I have personally reviewed the mammogram and ultrasound evidence of hypoechoic mass at 9:00 in the right breast measures 1.6 x 1.5 cm.  There is also an axillary lymph node with cortical thickening.  There is Also an area in the left breast consistent with a suspicious cystic lesion.  She now reports that she can feel the breast mass after she was told on ultrasound and mammogram.  Denies any pain.  No discharge or skin changes.  She specifically denies any past medical history of breast cancer in her  family. Her key approximately at age 38 she still menstruating. Denies any additional hormonal therapy. Is very functional and she is able to perform more than 4 METS of activity without any shortness of breath or chest pain.  Since the ultrasound and mammogram she has been very anxious with panic attacks. CBC and CMP were completely normal.   HPI  Past Medical History:  Diagnosis Date  . Anemia   . Depression   . Headache   . Hypertension   . Polycystic ovaries   . Seizures ([place])    AS A CHILD    Past Surgical History:  Procedure Laterality Date  . CHOLECYSTECTOMY  1997  . COLONOSCOPY WITH PROPOFOL N/A 09/25/2016   Procedure: COLONOSCOPY WITH PROPOFOL;  Surgeon: Christene Lye, MD;  Location: [facility] ENDOSCOPY;  Service: Endoscopy;  Laterality: N/A;  . KNEE SURGERY Left   . TOE ARTHROPLASTY Right 12/23/2014   Procedure: TOE ARTHROPLASTY;  Surgeon: Sharlotte Alamo, MD;  Location: [facility] ORS;  Service: Podiatry;  Laterality: Right;    Family History  Problem Relation Age of Onset  . Lung disease Mother   . Hypertension Mother   . Kidney disease Mother   . Diabetes Mother   . Colon polyps Mother        colon resection  . Hypertension Maternal  Grandmother   . Breast cancer Neg Hx   . Colon cancer Neg Hx     Social History Social History   Tobacco Use  . Smoking status: Never Smoker  . Smokeless tobacco: Never Used  Substance Use Topics  . Alcohol use: Yes    Alcohol/week: 0.0 standard drinks    Comment: occasional  . Drug use: No    No Known Allergies  Current Outpatient Medications  Medication Sig Dispense Refill  . buPROPion (WELLBUTRIN XL) 300 MG 24 hr tablet TAKE 1 TABLET BY MOUTH EVERY DAY 90 tablet 1  . Diethylpropion HCl 25 MG TABS Take 1 tablet (25 mg total) by mouth daily. 30 each 0  . eszopiclone (LUNESTA) 1 MG TABS tablet Take 1 tablet (1 mg total) by mouth at bedtime as needed for sleep. Take immediately before bedtime 30 tablet 0  . etonogestrel-ethinyl estradiol (NUVARING) 0.12-0.015 MG/24HR vaginal ring INSERT 1 RING VAGINALLY AS DIRECTED. REMOVE AND REPLACE AFTER 3 WEEKS FOR 3 PLACEMENTS, THEN WAIT 7 DAYS BEFORE INSERTING A NEW RING 3 each 4  . hydrOXYzine (ATARAX/VISTARIL) 10 MG tablet Take 1 tablet (10 mg total) by mouth daily as needed. 30 tablet 0  . ketoconazole (NIZORAL) 2 % cream Apply 1 application topically daily. 15 g 0  . metoprolol succinate (TOPROL-XL) 100 MG 24 hr tablet Take 1 tablet (  100 mg total) by mouth daily. MUST SCHEDULE ANNUAL EXAM FOR REFILLS 90 tablet 3  . Multiple Vitamins-Minerals (ONE-A-DAY WOMENS 50 PLUS PO) Take 1 tablet by mouth daily at 12 noon.    . naproxen (NAPROSYN) 500 MG tablet Take 1 tablet (500 mg total) by mouth 2 (two) times daily with a meal. 180 tablet 3  . sertraline (ZOLOFT) 100 MG tablet Take 1 tablet (100 mg total) by mouth daily. 90 tablet 1  . triamterene-hydrochlorothiazide (MAXZIDE-25) 37.5-25 MG tablet Take 1 tablet by mouth daily. 90 tablet 3  . Vitamin D, Ergocalciferol, (DRISDOL) 50000 units CAPS capsule Take 1 capsule (50,000 Units total) by mouth every 7 (seven) days. 12 capsule 0  . zolmitriptan (ZOMIG) 5 MG tablet TAKE 1 TABLET (5 MG TOTAL) BY  MOUTH ONCE AS NEEDED. MAY REPEAT IN TWO HOURS AS NEEDED 12 tablet 1  . ALPRAZolam (XANAX) 0.5 MG tablet Take 1 tablet (0.5 mg total) by mouth every 12 (twelve) hours as needed for anxiety. 12 tablet 0   No current facility-administered medications for this visit.      Review of Systems Full ROS  was asked and was negative except for the information on the HPI  Physical Exam Blood pressure 130/74, pulse 76, temperature 97.9 F (36.6 C), temperature source Skin, height 5' (1.524 m), weight 200 lb (90.7 kg), SpO2 98 %. CONSTITUTIONAL: NAD EYES: Pupils are equal, round, and reactive to light, Sclera are non-icteric. EARS, NOSE, MOUTH AND THROAT: The oropharynx is clear. The oral mucosa is pink and moist. Hearing is intact to voice. LYMPH NODES:  Lymph nodes in the neck are normal. BREAST: 4 cm solid mass on Right breast 10 o'clock 5 cms from nipple. No evidence of clinical axillary LAD. Skin and nipple w/o lesion.  RESPIRATORY:  Lungs are clear. There is normal respiratory effort, with equal breath sounds bilaterally, and without pathologic use of accessory muscles. CARDIOVASCULAR: Heart is regular without murmurs, gallops, or rubs. GI: The abdomen is  soft, nontender, and nondistended. There are no palpable masses. There is no hepatosplenomegaly. There are normal bowel sounds in all quadrants. GU: Rectal deferred.   MUSCULOSKELETAL: Normal muscle strength and tone. No cyanosis or edema.   SKIN: Turgor is good and there are no pathologic skin lesions or ulcers. NEUROLOGIC: Motor and sensation is grossly normal. Cranial nerves are grossly intact. PSYCH:  Oriented to person, place and time. Affect is normal.  Data Reviewed  I have personally reviewed the patient's imaging, laboratory findings and medical records.    Assessment/Plan Possible right breast mass highly suspicious of malignancy.  Completely agree with further biopsies of the right breast mass and right axillary lymphadenopathy  as well as a left biopsy of the cystic lesion. Had an extensive discussion with her regarding the probability of breast cancer and the multidisciplinary treatment.  I have discussed with her briefly the options of therapy to include chemotherapy, surgery, radiation.  Right now we definitely need a tissue diagnosis and further work-up before any therapy is instituted.  Given the severe anxiety I will prescribe a short course of Xanax as needed.  I will see her back in a couple weeks and hopefully at that time we will have pathology results.   A copy of this report was sent to the referring provider Time spent with the patient was 60 minutes, with more than 50% of the time spent in face-to-face education, counseling and care coordination.     Caroleen Hamman, MD [facility] General Surgeon  05/13/2018, 9:13 AM

## 2018-05-14 ENCOUNTER — Ambulatory Visit: Payer: 59

## 2018-05-18 ENCOUNTER — Other Ambulatory Visit: Payer: Self-pay | Admitting: Pathology

## 2018-05-18 LAB — SURGICAL PATHOLOGY

## 2018-05-21 ENCOUNTER — Telehealth: Payer: Self-pay | Admitting: *Deleted

## 2018-05-21 NOTE — Telephone Encounter (Signed)
Called patient today since she cancelled her appointment that was scheduled for Monday, 05-25-18 at 1:30 pm via the automated reminder system.   Patient states that she does not wish to reschedule appointment and that she will be going to [facility] for treatment.   Message routed to Dr. Dahlia Byes regarding the above.

## 2018-05-25 ENCOUNTER — Ambulatory Visit: Payer: 59 | Admitting: Surgery

## 2018-05-26 DIAGNOSIS — C50411 Malignant neoplasm of upper-outer quadrant of right female breast: Secondary | ICD-10-CM | POA: Insufficient documentation

## 2018-05-26 DIAGNOSIS — Z170 Estrogen receptor positive status [ER+]: Secondary | ICD-10-CM | POA: Insufficient documentation

## 2018-06-03 ENCOUNTER — Encounter: Payer: Self-pay | Admitting: Obstetrics and Gynecology

## 2018-06-03 ENCOUNTER — Ambulatory Visit (INDEPENDENT_AMBULATORY_CARE_PROVIDER_SITE_OTHER): Payer: 59 | Admitting: Obstetrics and Gynecology

## 2018-06-03 VITALS — BP 124/69 | HR 83 | Ht 60.0 in | Wt 208.0 lb

## 2018-06-03 DIAGNOSIS — R102 Pelvic and perineal pain: Secondary | ICD-10-CM | POA: Diagnosis not present

## 2018-06-03 NOTE — Patient Instructions (Signed)
In women who are premenopausal at diagnosis, the NCCN Panel recommends tamoxifen treatment with or without ovarian suppression/ablation.  Ovarian ablation may be accomplished by surgical oophorectomy or by ovarian irradiation. ° °In two randomized trials (TEXT and SOFT), premenopausal women with hormone receptor-positive early-stage breast cancer showed statistical improvements in disease free survival.  For patients receiving the aromatase inhibitor exemestane plus ovarian suppression significantly reduces recurrences as compared with tamoxifen plus ovarian suppression.   ° °NCCN Guidelines Breast Cancer Version 3.2019 ° °

## 2018-06-03 NOTE — Progress Notes (Signed)
Gynecology Pelvic Pain Evaluation   Chief Complaint:  Chief Complaint  Patient presents with  . Pelvic Pain    painful intercourse    History of Present Illness:   Patient is a 48 y.o. G0P0000 who LMP was Patient's last menstrual period was 05/19/2018 (exact date)., presents today for a problem visit.  She complains of pelvic pain that has been present over the past 1.5-2 weeks.     Her pain is localized to the central deep pelvis area, described as sharp, and its severity is described as moderate. The pain radiates to the  Non-radiating. She has these associated symptoms which include none. Patient has these modifiers which include spontaneous but episodes have last from a few second/minutes to several hours that make it better and lying down that make it worse.  Context includes: spontaneous.  She denies any associate with bowl movements (reportes regular no constipation or diarrhea), no fevers, no chills, no history of fibroids.   Previous evaluation: none. Prior Diagnosis: none. Previous Treatment: none.  She is scheduled to start treatment for recently diagnosed breast cancer later this month.  Review of Systems: Review of Systems  Constitutional: Negative.   Gastrointestinal: Positive for abdominal pain. Negative for blood in stool, constipation, diarrhea, nausea and vomiting.  Genitourinary: Negative.   Skin: Negative.     Past Medical History:  Past Medical History:  Diagnosis Date  . Anemia   . Breast cancer ([place]) 04/2018   RIGHT  . Depression   . Headache   . Hypertension   . Polycystic ovaries   . Seizures ([place])    AS A CHILD    Past Surgical History:  Past Surgical History:  Procedure Laterality Date  . CHOLECYSTECTOMY  1997  . COLONOSCOPY WITH PROPOFOL N/A 09/25/2016   Procedure: COLONOSCOPY WITH PROPOFOL;  Surgeon: Christene Lye, MD;  Location: [facility] ENDOSCOPY;  Service: Endoscopy;  Laterality: N/A;  . KNEE SURGERY Left   . TOE ARTHROPLASTY  Right 12/23/2014   Procedure: TOE ARTHROPLASTY;  Surgeon: Sharlotte Alamo, MD;  Location: [facility] ORS;  Service: Podiatry;  Laterality: Right;    Gynecologic History:  Patient's last menstrual period was 05/19/2018 (exact date).  Obstetric History: G0P0000  Family History:  Family History  Problem Relation Age of Onset  . Lung disease Mother   . Hypertension Mother   . Kidney disease Mother   . Diabetes Mother   . Colon polyps Mother        colon resection  . Hypertension Maternal Grandmother   . Breast cancer Neg Hx   . Colon cancer Neg Hx     Social History:  Social History   Socioeconomic History  . Marital status: Legally Separated    Spouse name: Not on file  . Number of children: Not on file  . Years of education: Not on file  . Highest education level: Not on file  Occupational History  . Not on file  Social Needs  . Financial resource strain: Not on file  . Food insecurity:    Worry: Not on file    Inability: Not on file  . Transportation needs:    Medical: Not on file    Non-medical: Not on file  Tobacco Use  . Smoking status: Never Smoker  . Smokeless tobacco: Never Used  Substance and Sexual Activity  . Alcohol use: Yes    Alcohol/week: 0.0 standard drinks    Comment: occasional  . Drug use: No  . Sexual activity: Yes  Birth control/protection: None  Lifestyle  . Physical activity:    Days per week: Not on file    Minutes per session: Not on file  . Stress: Not on file  Relationships  . Social connections:    Talks on phone: Not on file    Gets together: Not on file    Attends religious service: Not on file    Active member of club or organization: Not on file    Attends meetings of clubs or organizations: Not on file    Relationship status: Not on file  . Intimate partner violence:    Fear of current or ex partner: Not on file    Emotionally abused: Not on file    Physically abused: Not on file    Forced sexual activity: Not on file  Other  Topics Concern  . Not on file  Social History Narrative  . Not on file    Allergies:  No Known Allergies  Medications: Prior to Admission medications   Medication Sig Start Date End Date Taking? Authorizing Provider  ALPRAZolam Duanne Moron) 0.5 MG tablet Take 1 tablet (0.5 mg total) by mouth every 12 (twelve) hours as needed for anxiety. 05/11/18  Yes Pabon, Diego F, MD  buPROPion (WELLBUTRIN XL) 300 MG 24 hr tablet TAKE 1 TABLET BY MOUTH EVERY DAY 05/01/18  Yes Baity, Coralie Keens, NP  eszopiclone (LUNESTA) 1 MG TABS tablet Take 1 tablet (1 mg total) by mouth at bedtime as needed for sleep. Take immediately before bedtime 03/09/18  Yes Baity, Coralie Keens, NP  hydrOXYzine (ATARAX/VISTARIL) 10 MG tablet Take 1 tablet (10 mg total) by mouth daily as needed. 03/09/18  Yes Jearld Fenton, NP  metoprolol succinate (TOPROL-XL) 100 MG 24 hr tablet Take 1 tablet (100 mg total) by mouth daily. MUST SCHEDULE ANNUAL EXAM FOR REFILLS 04/30/17  Yes Baity, Coralie Keens, NP  Multiple Vitamins-Minerals (ONE-A-DAY WOMENS 50 PLUS PO) Take 1 tablet by mouth daily at 12 noon.   Yes [provider]  naproxen (NAPROSYN) 500 MG tablet Take 1 tablet (500 mg total) by mouth 2 (two) times daily with a meal. 04/30/17  Yes Baity, Coralie Keens, NP  sertraline (ZOLOFT) 100 MG tablet Take 1 tablet (100 mg total) by mouth daily. 04/16/18  Yes Jearld Fenton, NP  triamterene-hydrochlorothiazide (MAXZIDE-25) 37.5-25 MG tablet Take 1 tablet by mouth daily. 04/30/17  Yes Baity, Coralie Keens, NP  zolmitriptan (ZOMIG) 5 MG tablet TAKE 1 TABLET (5 MG TOTAL) BY MOUTH ONCE AS NEEDED. MAY REPEAT IN TWO HOURS AS NEEDED 08/19/17  Yes Jearld Fenton, NP  Diethylpropion HCl 25 MG TABS Take 1 tablet (25 mg total) by mouth daily. Patient not taking: Reported on 06/03/2018 11/20/17   Gae Dry, MD  Vitamin D, Ergocalciferol, (DRISDOL) 50000 units CAPS capsule Take 1 capsule (50,000 Units total) by mouth every 7 (seven) days. Patient not taking: Reported on  06/03/2018 12/15/17   Jearld Fenton, NP    Physical Exam Vitals: Blood pressure 124/69, pulse 83, height 5' (1.524 m), weight 208 lb (94.3 kg), last menstrual period 05/19/2018.  General: NAD HEENT: normocephalic, anicteric Pulmonary: No increased work of breathing Abdomen: soft, non-tender, non-distended.  Umbilicus without lesions.  No hepatomegaly, splenomegaly or masses palpable. No evidence of hernia  Genitourinary:  External: Normal external female genitalia.  Normal urethral meatus, normal  Bartholin's and Skene's glands.    Vagina: Normal vaginal mucosa, no evidence of prolapse.    Cervix: Grossly normal in appearance,  no bleeding  Uterus: Non-enlarged, mobile, normal contour.  No CMT  Adnexa: ovaries non-enlarged, no adnexal masses  Rectal: deferred  Lymphatic: no evidence of inguinal lymphadenopathy Extremities: no edema, erythema, or tenderness Neurologic: Grossly intact Psychiatric: mood appropriate, affect full  Female chaperone present for pelvic portion of the physical exam  Assessment: 48 y.o. G0P0000 with recent onset abdominal pain/pelvic pain  Problem List Items Addressed This Visit    None    Visit Diagnoses    Pelvic pain    -  Primary   Relevant Orders   US Transvaginal Non-OB       1) We discussed the possible etiologies for pelvic pain in women.  Gynecologic causes may include endometriosis, adenomyosis, pelvic inflammatory disease (PID), ovarian cysts, ovarian or tubal torsion, and in rare case gynecologic malignancy such as cervical, uterine, or ovarian cancer.  In addition thee possibility of non-gynecologic etiologies such as urinary or GI tract pathology or disordered, as well as musculoskeletal problems.  The goal is to complete a basic work up in hopes of identifying the underlying cause which in turn will dictate treatment.  In the meantime supportive measures such as localized heat, and NSAIDs are reasonable first steps.     - Prescription drug  database was not reviewed, UDS was not ordered - Transvaginal ultrasound ordered - Blood work obtained today No  - Cervical cultures No - UA not ordered no urinary symptoms  2) In women who are premenopausal at diagnosis, the NCCN Panel recommends tamoxifen treatment with or without ovarian suppression/ablation.  Ovarian ablation may be accomplished by surgical oophorectomy or by ovarian irradiation.  In two randomized trials (TEXT and SOFT), premenopausal women with hormone receptor-positive early-stage breast cancer showed statistical improvements in disease free survival.  For patients receiving the aromatase inhibitor exemestane plus ovarian suppression significantly reduces recurrences as compared with tamoxifen plus ovarian suppression.    NCCN Guidelines Breast Cancer Version 3.2019  I also discussed MyRisk testing with the patient as there are additional considerations in proceeding with BSP in these patients. Recommendation is for risk-reducing salping-oophorectomy between age 49-40.  Ovarian cancer onset in patient with BRCA2 mutation on average occur 8-10 years later than in patient with BRCA2 mutations it is reasonable to delay risk-reducing oophorectomy until age 40-45 unless age at diagnosis in the family history warrants earlier intervention.  Salpingectomy solely is not considered standard of care for risk reduction although trial are currently looking at interval salpingectomy prior to proceeding with risk-reducing oophorectomy.  Limited data suggest a small increased uterine serous cancer risk in patient with BRCA 1 mutations.  For patient who have not elected for risk-reducing salpingo-oophorectomy transvaginal ultrasound and CA-125 screening, although of uncertain benefit, maybe started at age 67-35.  NCCN Guideline Version 1.2020 BRCA-Pathogenic/Likely Pathogenic Variant - Positive Management   3) A total of 30 minutes were spent in face-to-face contact with the patient during  this encounter with over half of that time devoted to counseling and coordination of care.  4) Return in about 1 month (around 07/02/2018) for GYN [place] and follow up.   Malachy Mood, MD, [facility], [facility] 06/03/2018, 2:36 PM

## 2018-06-05 ENCOUNTER — Telehealth: Payer: Self-pay | Admitting: Obstetrics and Gynecology

## 2018-06-05 NOTE — Telephone Encounter (Signed)
-----   Message from Malachy Mood, MD sent at 06/05/2018  6:19 AM EST ----- Regarding: [place] and follow up I accidentally put her in for 1 month follow up I meant 1 week for her follow up and ultrasound

## 2018-06-05 NOTE — Telephone Encounter (Signed)
Patient is schedule 06/11/18 for u/s at 12 and follow at 2 pm in [place] with AMS

## 2018-06-05 NOTE — Addendum Note (Signed)
Addended by: Dorthula Nettles on: 06/05/2018 06:25 AM   Modules accepted: Level of Service

## 2018-06-07 ENCOUNTER — Other Ambulatory Visit: Payer: Self-pay | Admitting: Internal Medicine

## 2018-06-07 DIAGNOSIS — G43919 Migraine, unspecified, intractable, without status migrainosus: Secondary | ICD-10-CM

## 2018-06-08 ENCOUNTER — Other Ambulatory Visit: Payer: Self-pay | Admitting: Internal Medicine

## 2018-06-09 NOTE — Telephone Encounter (Signed)
Zomig last filled 07/2017 with 1 refill... please advise

## 2018-06-10 ENCOUNTER — Ambulatory Visit (INDEPENDENT_AMBULATORY_CARE_PROVIDER_SITE_OTHER): Payer: 59

## 2018-06-10 DIAGNOSIS — N83291 Other ovarian cyst, right side: Secondary | ICD-10-CM

## 2018-06-10 DIAGNOSIS — R102 Pelvic and perineal pain: Secondary | ICD-10-CM | POA: Diagnosis not present

## 2018-06-11 ENCOUNTER — Encounter: Payer: Self-pay | Admitting: Obstetrics and Gynecology

## 2018-06-11 ENCOUNTER — Ambulatory Visit: Payer: 59

## 2018-06-11 ENCOUNTER — Ambulatory Visit (INDEPENDENT_AMBULATORY_CARE_PROVIDER_SITE_OTHER): Payer: 59 | Admitting: Obstetrics and Gynecology

## 2018-06-11 VITALS — BP 126/66 | HR 86 | Wt 206.0 lb

## 2018-06-11 DIAGNOSIS — N83201 Unspecified ovarian cyst, right side: Secondary | ICD-10-CM | POA: Diagnosis not present

## 2018-06-11 DIAGNOSIS — R102 Pelvic and perineal pain: Secondary | ICD-10-CM | POA: Diagnosis not present

## 2018-06-11 DIAGNOSIS — C50911 Malignant neoplasm of unspecified site of right female breast: Secondary | ICD-10-CM | POA: Diagnosis not present

## 2018-06-11 DIAGNOSIS — C50919 Malignant neoplasm of unspecified site of unspecified female breast: Secondary | ICD-10-CM

## 2018-06-11 NOTE — Progress Notes (Signed)
Gynecology Ultrasound Follow Up  Chief Complaint:  Chief Complaint  Patient presents with  . Follow-up    GYN Ultrasound     History of Present Illness: Patient is a 48 y.o. female who presents today for ultrasound evaluation of pelvic pain.  Ultrasound demonstrates the following findgins Adnexa: right ovary with two small hemorrhagic cyst, left ovary normal Uterus: Non-enlarged with endometrial stripe without focal abnormalities Additional: no free fluid  Review of Systems: Review of Systems  Constitutional: Negative for chills and fever.  Gastrointestinal: Negative for abdominal pain, constipation, diarrhea, heartburn, nausea and vomiting.  Genitourinary: Negative for dysuria, frequency and urgency.    Past Medical History:  Past Medical History:  Diagnosis Date  . Anemia   . Breast cancer ([place]) 04/2018   RIGHT  . Depression   . Headache   . Hypertension   . Polycystic ovaries   . Seizures ([place])    AS A CHILD    Past Surgical History:  Past Surgical History:  Procedure Laterality Date  . CHOLECYSTECTOMY  1997  . COLONOSCOPY WITH PROPOFOL N/A 09/25/2016   Procedure: COLONOSCOPY WITH PROPOFOL;  Surgeon: Christene Lye, MD;  Location: [facility] ENDOSCOPY;  Service: Endoscopy;  Laterality: N/A;  . KNEE SURGERY Left   . TOE ARTHROPLASTY Right 12/23/2014   Procedure: TOE ARTHROPLASTY;  Surgeon: Sharlotte Alamo, MD;  Location: [facility] ORS;  Service: Podiatry;  Laterality: Right;    Gynecologic History:  Patient's last menstrual period was 05/19/2018 (exact date).  Family History:  Family History  Problem Relation Age of Onset  . Lung disease Mother   . Hypertension Mother   . Kidney disease Mother   . Diabetes Mother   . Colon polyps Mother        colon resection  . Hypertension Maternal Grandmother   . Breast cancer Neg Hx   . Colon cancer Neg Hx     Social History:  Social History   Socioeconomic History  . Marital status: Legally Separated    Spouse  name: Not on file  . Number of children: Not on file  . Years of education: Not on file  . Highest education level: Not on file  Occupational History  . Not on file  Social Needs  . Financial resource strain: Not on file  . Food insecurity:    Worry: Not on file    Inability: Not on file  . Transportation needs:    Medical: Not on file    Non-medical: Not on file  Tobacco Use  . Smoking status: Never Smoker  . Smokeless tobacco: Never Used  Substance and Sexual Activity  . Alcohol use: Yes    Alcohol/week: 0.0 standard drinks    Comment: occasional  . Drug use: No  . Sexual activity: Yes    Birth control/protection: None  Lifestyle  . Physical activity:    Days per week: Not on file    Minutes per session: Not on file  . Stress: Not on file  Relationships  . Social connections:    Talks on phone: Not on file    Gets together: Not on file    Attends religious service: Not on file    Active member of club or organization: Not on file    Attends meetings of clubs or organizations: Not on file    Relationship status: Not on file  . Intimate partner violence:    Fear of current or ex partner: Not on file    Emotionally abused:  Not on file    Physically abused: Not on file    Forced sexual activity: Not on file  Other Topics Concern  . Not on file  Social History Narrative  . Not on file    Allergies:  No Known Allergies  Medications: Prior to Admission medications   Medication Sig Start Date End Date Taking? Authorizing Provider  ALPRAZolam Duanne Moron) 0.5 MG tablet Take 1 tablet (0.5 mg total) by mouth every 12 (twelve) hours as needed for anxiety. 05/11/18   Pabon, Diego F, MD  buPROPion (WELLBUTRIN XL) 300 MG 24 hr tablet TAKE 1 TABLET BY MOUTH EVERY DAY 05/01/18   Jearld Fenton, NP  Diethylpropion HCl 25 MG TABS Take 1 tablet (25 mg total) by mouth daily. Patient not taking: Reported on 06/03/2018 11/20/17   Gae Dry, MD  eszopiclone (LUNESTA) 1 MG TABS tablet  Take 1 tablet (1 mg total) by mouth at bedtime as needed for sleep. Take immediately before bedtime 03/09/18   Jearld Fenton, NP  hydrOXYzine (ATARAX/VISTARIL) 10 MG tablet Take 1 tablet (10 mg total) by mouth daily as needed. 03/09/18   Jearld Fenton, NP  metoprolol succinate (TOPROL-XL) 100 MG 24 hr tablet Take 1 tablet (100 mg total) by mouth daily. MUST SCHEDULE ANNUAL EXAM FOR REFILLS 04/30/17   Jearld Fenton, NP  Multiple Vitamins-Minerals (ONE-A-DAY WOMENS 50 PLUS PO) Take 1 tablet by mouth daily at 12 noon.    [provider]  naproxen (NAPROSYN) 500 MG tablet TAKE 1 TABLET (500 MG TOTAL) BY MOUTH 2 (TWO) TIMES DAILY WITH A MEAL. 06/09/18   Jearld Fenton, NP  sertraline (ZOLOFT) 100 MG tablet Take 1 tablet (100 mg total) by mouth daily. 04/16/18   Jearld Fenton, NP  triamterene-hydrochlorothiazide (MAXZIDE-25) 37.5-25 MG tablet TAKE 1 TABLET BY MOUTH EVERY DAY 06/09/18   Jearld Fenton, NP  Vitamin D, Ergocalciferol, (DRISDOL) 50000 units CAPS capsule Take 1 capsule (50,000 Units total) by mouth every 7 (seven) days. Patient not taking: Reported on 06/03/2018 12/15/17   Jearld Fenton, NP  zolmitriptan (ZOMIG) 5 MG tablet TAKE 1 TABLET (5 MG TOTAL) BY MOUTH ONCE AS NEEDED. MAY REPEAT IN TWO HOURS AS NEEDED 06/09/18   Jearld Fenton, NP    Physical Exam Vitals: Blood pressure 126/66, pulse 86, weight 206 lb (93.4 kg), last menstrual period 05/19/2018.  General: NAD HEENT: normocephalic, anicteric Pulmonary: No increased work of breathing Extremities: no edema, erythema, or tenderness Neurologic: Grossly intact, normal gait Psychiatric: mood appropriate, affect full  US Transvaginal Non-ob  Result Date: 06/10/2018 Patient Name: Mallory Meyers DOB: 09-14-1970 MRN: [id number] ULTRASOUND REPORT Location: [facility] Date of Service: 06/10/2018 Indications:Pelvic Pain after being taken off birth control pills. Findings: The uterus is anteverted and measures 7.4 x 4.3 x  3.0cm. Echo texture is homogenous without evidence of focal masses. The Endometrium measures 4.44m. Right Ovary measures 3.6 x 3.2 x 2.0cm. 2 Complex cysts seen side by side with small amount of fluid near ovary. Ultrasound appearance of hemorrhagic cysts.     1. 1.8 x 1.3 x 1.2cm     2. 0.9 x 0.7 x 1.4cm Left Ovary measures 3.0 x 2.6 x 1.9cm. It is normal in appearance. Survey of the adnexa demonstrates no adnexal masses. Small amount of free fluid near right ovary. Impression: 1. Two complex right ovarian cysts. Hemorrhagic cysts vs other. Otherwise, normal gyn ultrasound. Recommendations: 1.Clinical correlation with the patient's History and Physical Exam. Abby  Despina Arias, RDMS RVT Images reviewed.  Normal GYN study with two likely physiology hemorrhagic cysts.  Depending on level of symptoms follow up in 4-6 week may be considered.  Endometriomas are also in the differential.  Malachy Mood, MD, [facility], [facility]     Assessment: 48 y.o. G0P0000 follow up ultrasound pelvic pain  Plan: Problem List Items Addressed This Visit      Other   Malignant neoplasm of female breast [facility]) - Primary    Other Visit Diagnoses    Pelvic pain       Hemorrhagic cyst of right ovary           1) Counseling BRCA Discussed genetic counseling for  [facility] testing because of her personal history of breast cancer under 27.  If a mutation is found, the patient would be advised of an increased risk of breast cancer and ovarian cancer.  If a mutation is identified other family members could be tested and would have the opportunity to take advantage of approaches to prevention and early detection of breast and ovarian cancer.     2) In the setting of potential positive BRCA mutation - Recommendation is for risk-reducing salping-oophorectomy between age 41-40.  Ovarian cancer onset in patient with BRCA2 mutation on average occur 8-10 years later than in patient with BRCA2 mutations it is  reasonable to delay risk-reducing oophorectomy until age 7-45 unless age at diagnosis in the family history warrants earlier intervention.  Salpingectomy solely is not considered standard of care for risk reduction although trial are currently looking at interval salpingectomy prior to proceeding with risk-reducing oophorectomy.  Limited data suggest a small increased uterine serous cancer risk in patient with BRCA 1 mutations.  For patient who have not elected for risk-reducing salpingo-oophorectomy transvaginal ultrasound and CA-125 screening, although of uncertain benefit, maybe started at age 2-35.  NCCN Guideline Version 1.2020 BRCA-Pathogenic/Likely Pathogenic Variant - Positive Management  3) In setting of negative BRCA results -  In women who are premenopausal at diagnosis, the NCCN Panel recommends tamoxifen treatment with or without ovarian suppression/ablation.  Ovarian ablation may be accomplished by surgical oophorectomy or by ovarian irradiation.  In two randomized trials (TEXT and SOFT), premenopausal women with hormone receptor-positive early-stage breast cancer showed statistical improvements in disease free survival.  For patients receiving the aromatase inhibitor exemestane plus ovarian suppression significantly reduces recurrences as compared with tamoxifen plus ovarian suppression.    NCCN Guidelines Breast Cancer Version 3.2019  4) Hemorrhagic ovarian cyst vs endometrioma discussed ultrasound imaging in 6 weeks to document resolution.  However, should she elect to proceed with BSO no management changes and would defer repeat ultrasound  5) A total of 15 minutes were spent in face-to-face contact with the patient during this encounter with over half of that time devoted to counseling and coordination of care.  6) Will call if able to have BSO jointly done at [facility] at time of breast surgery or if not will arrange with myself    Malachy Mood, MD, [place], [place] 06/13/2018, 5:00 PM

## 2018-06-13 DIAGNOSIS — C50919 Malignant neoplasm of unspecified site of unspecified female breast: Secondary | ICD-10-CM | POA: Insufficient documentation

## 2018-06-13 DIAGNOSIS — Z853 Personal history of malignant neoplasm of breast: Secondary | ICD-10-CM | POA: Insufficient documentation

## 2018-06-16 ENCOUNTER — Telehealth: Payer: Self-pay

## 2018-06-16 DIAGNOSIS — Z029 Encounter for administrative examinations, unspecified: Secondary | ICD-10-CM

## 2018-06-16 NOTE — Telephone Encounter (Signed)
FMLA/DISABILITY form c additional questions filled out for [employer], signature obtained, and given to [place] for processing.

## 2018-06-18 ENCOUNTER — Ambulatory Visit: Payer: 59 | Admitting: Psychology

## 2018-06-18 DIAGNOSIS — F331 Major depressive disorder, recurrent, moderate: Secondary | ICD-10-CM | POA: Diagnosis not present

## 2018-07-02 ENCOUNTER — Ambulatory Visit: Payer: 59 | Admitting: Obstetrics & Gynecology

## 2018-07-02 ENCOUNTER — Ambulatory Visit (INDEPENDENT_AMBULATORY_CARE_PROVIDER_SITE_OTHER): Payer: 59 | Admitting: Psychology

## 2018-07-02 ENCOUNTER — Other Ambulatory Visit: Payer: 59

## 2018-07-02 DIAGNOSIS — F331 Major depressive disorder, recurrent, moderate: Secondary | ICD-10-CM

## 2018-07-06 DIAGNOSIS — C50911 Malignant neoplasm of unspecified site of right female breast: Secondary | ICD-10-CM | POA: Insufficient documentation

## 2018-07-20 ENCOUNTER — Encounter: Payer: Self-pay | Admitting: Internal Medicine

## 2018-07-23 ENCOUNTER — Ambulatory Visit: Payer: 59 | Admitting: Psychology

## 2018-07-31 ENCOUNTER — Encounter: Payer: Self-pay | Admitting: Internal Medicine

## 2018-08-03 ENCOUNTER — Ambulatory Visit (INDEPENDENT_AMBULATORY_CARE_PROVIDER_SITE_OTHER): Payer: 59 | Admitting: Internal Medicine

## 2018-08-03 ENCOUNTER — Other Ambulatory Visit: Payer: Self-pay

## 2018-08-03 ENCOUNTER — Encounter: Payer: Self-pay | Admitting: Internal Medicine

## 2018-08-03 DIAGNOSIS — F419 Anxiety disorder, unspecified: Secondary | ICD-10-CM

## 2018-08-03 DIAGNOSIS — G43C1 Periodic headache syndromes in child or adult, intractable: Secondary | ICD-10-CM

## 2018-08-03 DIAGNOSIS — F5104 Psychophysiologic insomnia: Secondary | ICD-10-CM

## 2018-08-03 DIAGNOSIS — I10 Essential (primary) hypertension: Secondary | ICD-10-CM | POA: Diagnosis not present

## 2018-08-03 DIAGNOSIS — G8929 Other chronic pain: Secondary | ICD-10-CM

## 2018-08-03 DIAGNOSIS — F329 Major depressive disorder, single episode, unspecified: Secondary | ICD-10-CM

## 2018-08-03 DIAGNOSIS — C50919 Malignant neoplasm of unspecified site of unspecified female breast: Secondary | ICD-10-CM

## 2018-08-03 DIAGNOSIS — M545 Low back pain: Secondary | ICD-10-CM

## 2018-08-03 MED ORDER — CYCLOBENZAPRINE HCL 10 MG PO TABS: 10.0000 mg | ORAL_TABLET | Freq: Every day | ORAL | 0 refills | Status: DC | PRN

## 2018-08-03 MED ORDER — ZOLPIDEM TARTRATE 5 MG PO TABS: 5.0000 mg | ORAL_TABLET | Freq: Every evening | ORAL | 0 refills | Status: DC | PRN

## 2018-08-03 MED ORDER — SERTRALINE HCL 100 MG PO TABS: 150.0000 mg | ORAL_TABLET | Freq: Every day | ORAL | 0 refills | Status: DC

## 2018-08-03 MED ORDER — BUTALBITAL-APAP-CAFFEINE 50-325-40 MG PO TABS: 1.0000 | ORAL_TABLET | Freq: Four times a day (QID) | ORAL | 0 refills | Status: DC | PRN

## 2018-08-03 MED ORDER — TOPIRAMATE 25 MG PO TABS: ORAL_TABLET | ORAL | 1 refills | Status: DC

## 2018-08-03 NOTE — Assessment & Plan Note (Signed)
Encouraged core strengthening, routine stretching Flexeril 10 mg refilled today

## 2018-08-03 NOTE — Assessment & Plan Note (Signed)
Deteriorated Will restart Topamax, will taper up to 100 mg as tolerated RX for Fioricet  Continue Zomig as needed

## 2018-08-03 NOTE — Patient Instructions (Signed)

## 2018-08-03 NOTE — Assessment & Plan Note (Signed)
Deteriorated Support offered today Will increase Sertraline to 150 mg daily.  Continue Wellbutrin, Hydroxyzine and Xanax

## 2018-08-03 NOTE — Progress Notes (Signed)
Virtual Visit via Video Note  I connected with [place] on 08/03/18 at 10:30 AM EDT by a video enabled telemedicine application and verified that I am speaking with the correct person using two identifiers.   I discussed the limitations of evaluation and management by telemedicine and the availability of in person appointments. The patient expressed understanding and agreed to proceed.  History of Present Illness:  Pt due for follow up of chronic conditions.  Breast Cancer, Right: s/p lumpectomy. She is now undergoing chemo and radiation. She follows with [facility].  Migraines: These are occurring more frequently, almost daily. They are located on the left side of her head. She describes the pain as throbbing. She reports associated sensitivity to light but denies dizziness, visual changes, sensitivity to sound, nausea or vomiting. She takes Zomig as needed with some relief but reports she is only getting 6 pills per month and this isn't covering her headaches. She has taken Topamax in the past, but not sure if this worked for her or not. She is not following with neurology.  Anxiety and Depression: Deteriorated with cancer diagnosis. She is taking Sertraline, Wellbutrin, Hydroxyzine and Xanax as prescribed. She is not currently seeing a therapist. She denies SI/HI.  HTN: She does not monitor her BP at home. She is taking Metoprolol and Triamterene-HCT as prescribed. ECG from 11/2014 reviewed.  Insomnia: She has trouble falling asleep and staying asleep. She is taking Lunesta as prescribed without any relief. There is no sleep study on file.   Chronic Back Pain: She has been having a lot of muscle spasms lately. She is taking Flexeril 10 mg as needed but she reports it is not providing much relief, she thinks this is because the RX is expired.   Observations/Objective:  Alert and oriented x 2 NAD [place] noted in left chest She is tearful at times during the interview  Assessment  and Plan:  See problem based charting  Follow Up Instructions:    I discussed the assessment and treatment plan with the patient. The patient was provided an opportunity to ask questions and all were answered. The patient agreed with the plan and demonstrated an understanding of the instructions.   The patient was advised to call back or seek an in-person evaluation if the symptoms worsen or if the condition fails to improve as anticipated.     Webb Silversmith, NP

## 2018-08-03 NOTE — Assessment & Plan Note (Signed)
Deteriorated Stop Lunesta Will trial Ambien 5 mg, RX sent to pharmacy Discussed routine sleep habits

## 2018-08-03 NOTE — Assessment & Plan Note (Signed)
S/p lumpectomy, currently on chemo and radiation Support offered today She will continue to follow with oncology

## 2018-08-03 NOTE — Assessment & Plan Note (Signed)
Continue Metoprolol, Triamterene-HCT as prescribed Reinforced DASH diet and exercise for weight loss CMET reviewed by [facility]

## 2018-08-13 ENCOUNTER — Encounter: Payer: Self-pay | Admitting: Internal Medicine

## 2018-08-17 ENCOUNTER — Ambulatory Visit (INDEPENDENT_AMBULATORY_CARE_PROVIDER_SITE_OTHER): Payer: 59 | Admitting: Internal Medicine

## 2018-08-17 ENCOUNTER — Encounter: Payer: Self-pay | Admitting: Internal Medicine

## 2018-08-17 DIAGNOSIS — F5104 Psychophysiologic insomnia: Secondary | ICD-10-CM | POA: Diagnosis not present

## 2018-08-17 DIAGNOSIS — F32A Depression, unspecified: Secondary | ICD-10-CM

## 2018-08-17 DIAGNOSIS — F329 Major depressive disorder, single episode, unspecified: Secondary | ICD-10-CM

## 2018-08-17 DIAGNOSIS — G43C1 Periodic headache syndromes in child or adult, intractable: Secondary | ICD-10-CM | POA: Diagnosis not present

## 2018-08-17 DIAGNOSIS — F419 Anxiety disorder, unspecified: Secondary | ICD-10-CM

## 2018-08-17 MED ORDER — BUTALBITAL-APAP-CAFFEINE 50-325-40 MG PO TABS: 1.0000 | ORAL_TABLET | Freq: Four times a day (QID) | ORAL | 2 refills | Status: DC | PRN

## 2018-08-17 NOTE — Progress Notes (Signed)
Virtual Visit via Video Note  I connected with Mallory Meyers on 08/17/18 at 10:00 AM EDT by a video enabled telemedicine application and verified that I am speaking with the correct person using two identifiers.   I discussed the limitations of evaluation and management by telemedicine and the availability of in person appointments. The patient expressed understanding and agreed to proceed.  Patient Location: Home Provider Location: Office History of Present Illness:  Pt due for 2 week follow up.  Migraines: Was started on Topamax and given Fioricet to alternate with Zomig. She reports the oncologist told her that the nausea medications cause her headaches. She has had significant improvement in her headaches, but gets chemo with the antinausea medication tomorrow, so she will see how that goes.  Mood Disorder: She has already noticed improvement in her mood by increase Sertraline to 150 mg daily. She denies SI/HI.  Insomnia: She reports she is actually sleeping better now that her mood and headaches have improved. She filled the Ambien but has not yet used it.   Observations/Objective:  Alert and oriented x 3 NAD Obese Coordination normal Well kempt Mood and affect appear normal. Behavior, judgment and thought content are normal  Assessment and Plan:  See problem based charting  Follow Up Instructions:    I discussed the assessment and treatment plan with the patient. The patient was provided an opportunity to ask questions and all were answered. The patient agreed with the plan and demonstrated an understanding of the instructions.   The patient was advised to call back or seek an in-person evaluation if the symptoms worsen or if the condition fails to improve as anticipated.    Webb Silversmith, NP

## 2018-08-17 NOTE — Assessment & Plan Note (Signed)
Sleeping better Advised her to use Ambien only if needed Will monitor for now

## 2018-08-17 NOTE — Assessment & Plan Note (Signed)
Improved with Topamax, will continue Continue Fioricet and Zomig Fioricet refilled today Will monitor

## 2018-08-17 NOTE — Patient Instructions (Signed)
General Headache Without Cause A headache is pain or discomfort that is felt around the head or neck area. There are many causes and types of headaches. In some cases, the cause may not be found. Follow these instructions at home: Watch your condition for any changes. Let your doctor know about them. Take these steps to help with your condition: Managing pain      Take over-the-counter and prescription medicines only as told by your doctor.  Lie down in a dark, quiet room when you have a headache.  If told, put ice on your head and neck area: ? Put ice in a plastic bag. ? Place a towel between your skin and the bag. ? Leave the ice on for 20 minutes, 2-3 times per day.  If told, put heat on the affected area. Use the heat source that your doctor recommends, such as a moist heat pack or a heating pad. ? Place a towel between your skin and the heat source. ? Leave the heat on for 20-30 minutes. ? Remove the heat if your skin turns bright red. This is very important if you are unable to feel pain, heat, or cold. You may have a greater risk of getting burned.  Keep lights dim if bright lights bother you or make your headaches worse. Eating and drinking  Eat meals on a regular schedule.  If you drink alcohol: ? Limit how much you use to:  0-1 drink a day for women.  0-2 drinks a day for men. ? Be aware of how much alcohol is in your drink. In the [place] one drink equals one 12 oz bottle of beer (355 mL), one 5 oz glass of wine (148 mL), or one 1 oz glass of hard liquor (44 mL).  Stop drinking caffeine, or reduce how much caffeine you drink. General instructions   Keep a journal to find out if certain things bring on headaches. For example, write down: ? What you eat and drink. ? How much sleep you get. ? Any change to your diet or medicines.  Get a massage or try other ways to relax.  Limit stress.  Sit up straight. Do not tighten (tense) your muscles.  Do not use any  products that contain nicotine or tobacco. This includes cigarettes, e-cigarettes, and chewing tobacco. If you need help quitting, ask your doctor.  Exercise regularly as told by your doctor.  Get enough sleep. This often means 7-9 hours of sleep each night.  Keep all follow-up visits as told by your doctor. This is important. Contact a doctor if:  Your symptoms are not helped by medicine.  You have a headache that feels different than the other headaches.  You feel sick to your stomach (nauseous) or you throw up (vomit).  You have a fever. Get help right away if:  Your headache gets very bad quickly.  Your headache gets worse after a lot of physical activity.  You keep throwing up.  You have a stiff neck.  You have trouble seeing.  You have trouble speaking.  You have pain in the eye or ear.  Your muscles are weak or you lose muscle control.  You lose your balance or have trouble walking.  You feel like you will pass out (faint) or you pass out.  You are mixed up (confused).  You have a seizure. Summary  A headache is pain or discomfort that is felt around the head or neck area.  There are many causes and   types of headaches. In some cases, the cause may not be found.  Keep a journal to help find out what causes your headaches. Watch your condition for any changes. Let your doctor know about them.  Contact a doctor if you have a headache that is different from usual, or if your headache is not helped by medicine.  Get help right away if your headache gets very bad, you throw up, you have trouble seeing, you lose your balance, or you have a seizure. This information is not intended to replace advice given to you by your health care provider. Make sure you discuss any questions you have with your health care provider. Document Released: 01/23/2008 Document Revised: 11/03/2017 Document Reviewed: 11/03/2017 Elsevier Interactive Patient Education  2019 Elsevier  Inc.  

## 2018-08-17 NOTE — Assessment & Plan Note (Signed)
Improved Continue Sertraline, Wellbutrin, Hydroxyzine and Xanax Support offered today Will monitor

## 2018-08-25 ENCOUNTER — Other Ambulatory Visit: Payer: Self-pay | Admitting: Internal Medicine

## 2018-08-27 ENCOUNTER — Encounter: Payer: Self-pay | Admitting: Internal Medicine

## 2018-08-27 DIAGNOSIS — E559 Vitamin D deficiency, unspecified: Secondary | ICD-10-CM

## 2018-08-31 ENCOUNTER — Other Ambulatory Visit (INDEPENDENT_AMBULATORY_CARE_PROVIDER_SITE_OTHER): Payer: 59

## 2018-08-31 ENCOUNTER — Other Ambulatory Visit: Payer: Self-pay

## 2018-08-31 DIAGNOSIS — E559 Vitamin D deficiency, unspecified: Secondary | ICD-10-CM | POA: Diagnosis not present

## 2018-08-31 LAB — VITAMIN D 25 HYDROXY (VIT D DEFICIENCY, FRACTURES): VITD: 14.04 ng/mL — ABNORMAL LOW (ref 30.00–100.00)

## 2018-09-01 MED ORDER — VITAMIN D (ERGOCALCIFEROL) 1.25 MG (50000 UNIT) PO CAPS: 50000.0000 [IU] | ORAL_CAPSULE | ORAL | 0 refills | Status: DC

## 2018-09-23 ENCOUNTER — Ambulatory Visit (INDEPENDENT_AMBULATORY_CARE_PROVIDER_SITE_OTHER): Payer: 59 | Admitting: Psychology

## 2018-09-23 DIAGNOSIS — F331 Major depressive disorder, recurrent, moderate: Secondary | ICD-10-CM

## 2018-09-26 ENCOUNTER — Other Ambulatory Visit: Payer: Self-pay | Admitting: Internal Medicine

## 2018-09-30 ENCOUNTER — Other Ambulatory Visit: Payer: Self-pay | Admitting: Internal Medicine

## 2018-10-11 ENCOUNTER — Other Ambulatory Visit: Payer: Self-pay | Admitting: Internal Medicine

## 2018-10-12 ENCOUNTER — Encounter: Payer: Self-pay | Admitting: *Deleted

## 2018-11-06 ENCOUNTER — Other Ambulatory Visit: Payer: Self-pay | Admitting: Internal Medicine

## 2018-11-16 ENCOUNTER — Other Ambulatory Visit: Payer: Self-pay | Admitting: Internal Medicine

## 2018-11-20 IMAGING — MG DIGITAL SCREENING BILATERAL MAMMOGRAM WITH CAD
4 series · 4 of 4 positions shown · non-contrast
Comparison: Previous exam(s).

CLINICAL DATA: Screening.

EXAM:
DIGITAL SCREENING BILATERAL MAMMOGRAM WITH CAD

[R MLO]
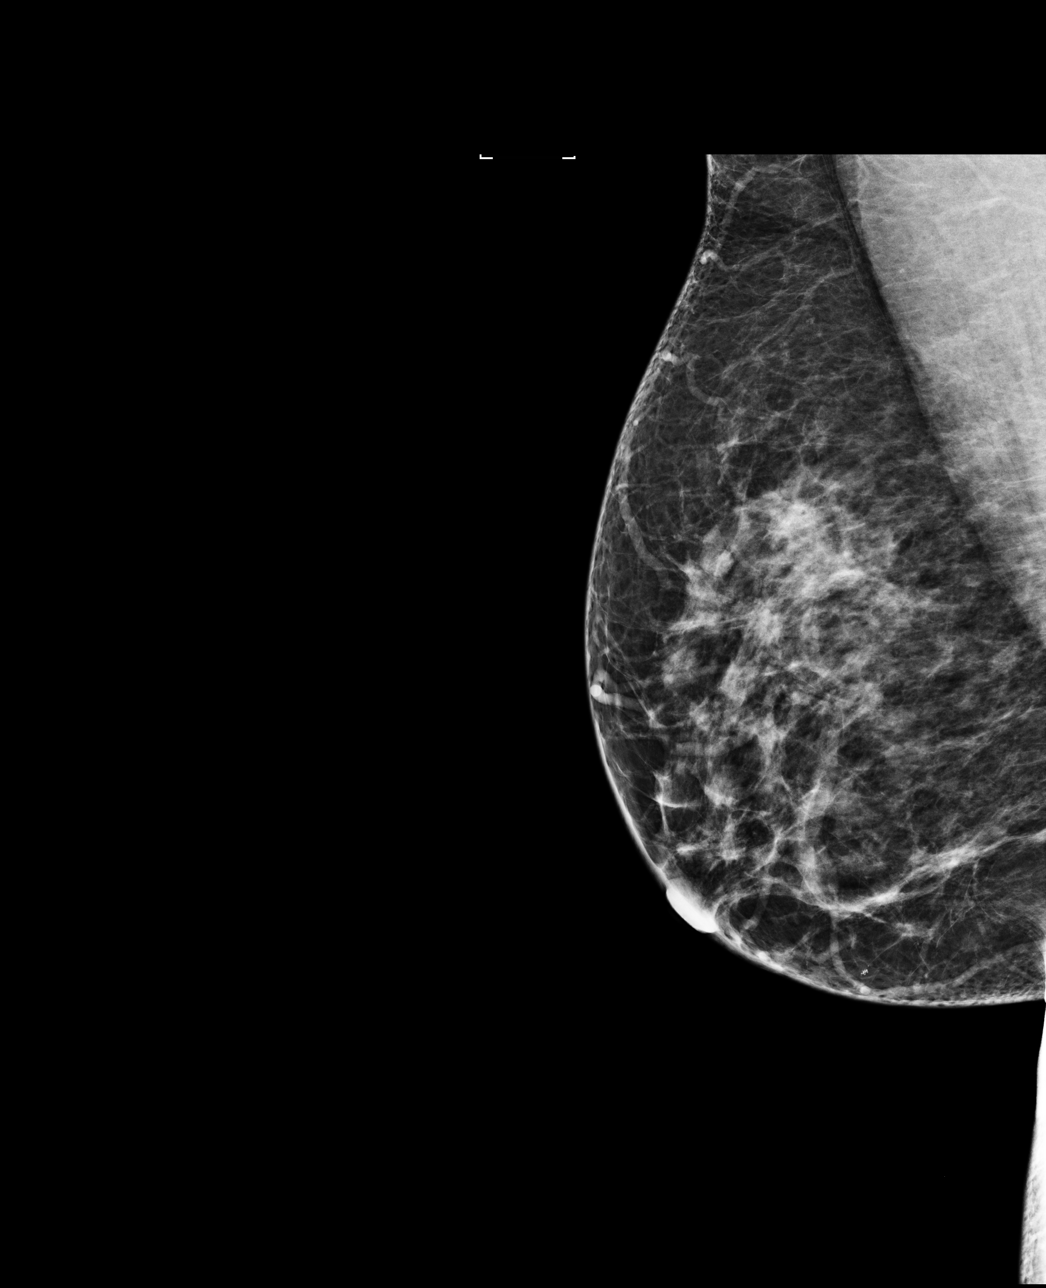

[L CC]
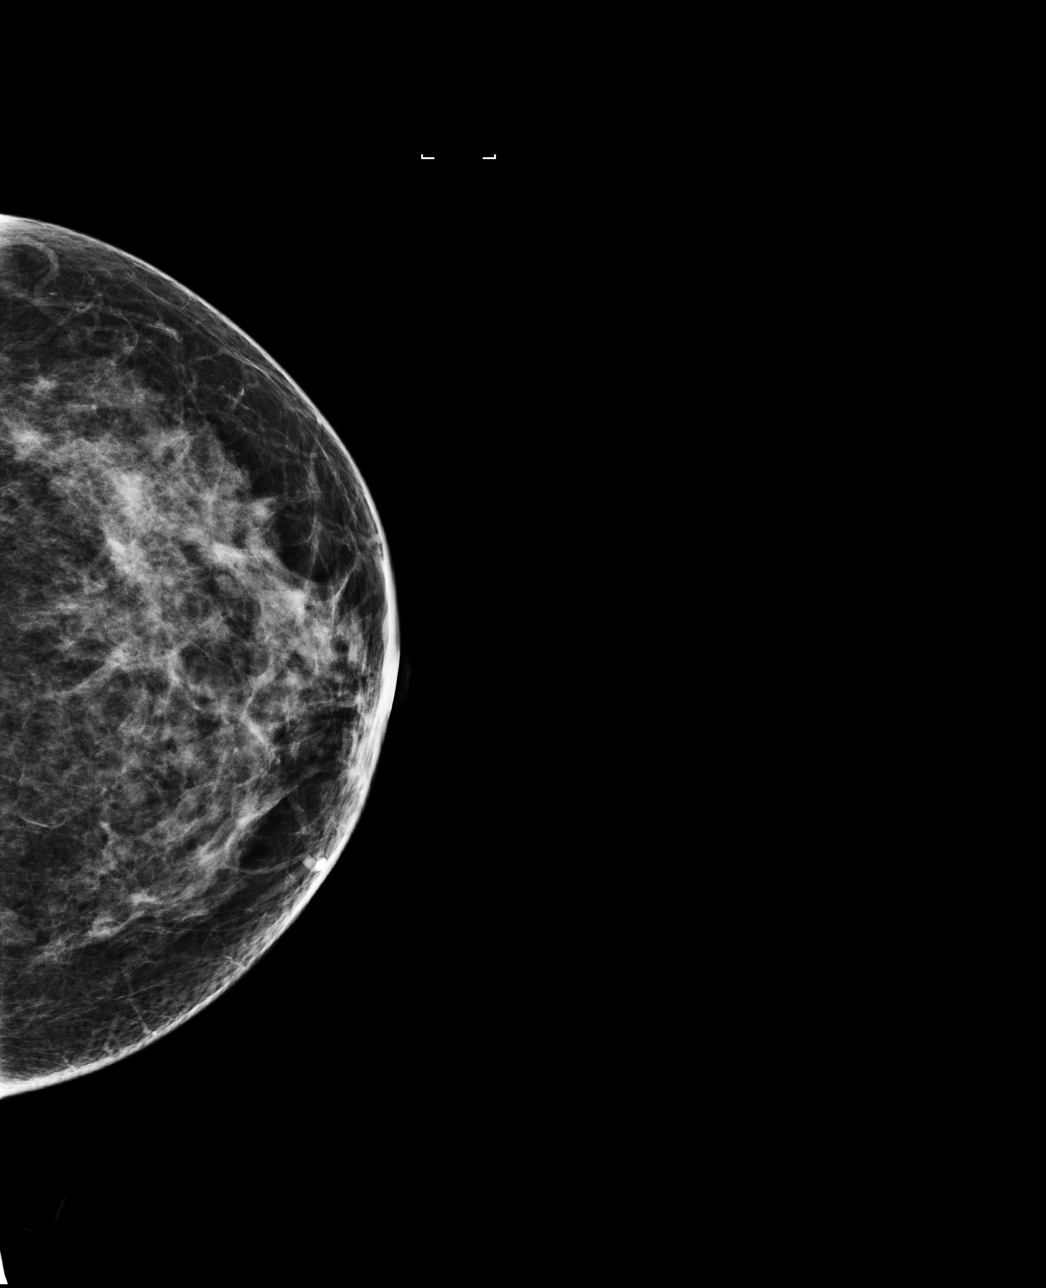

[R CC]
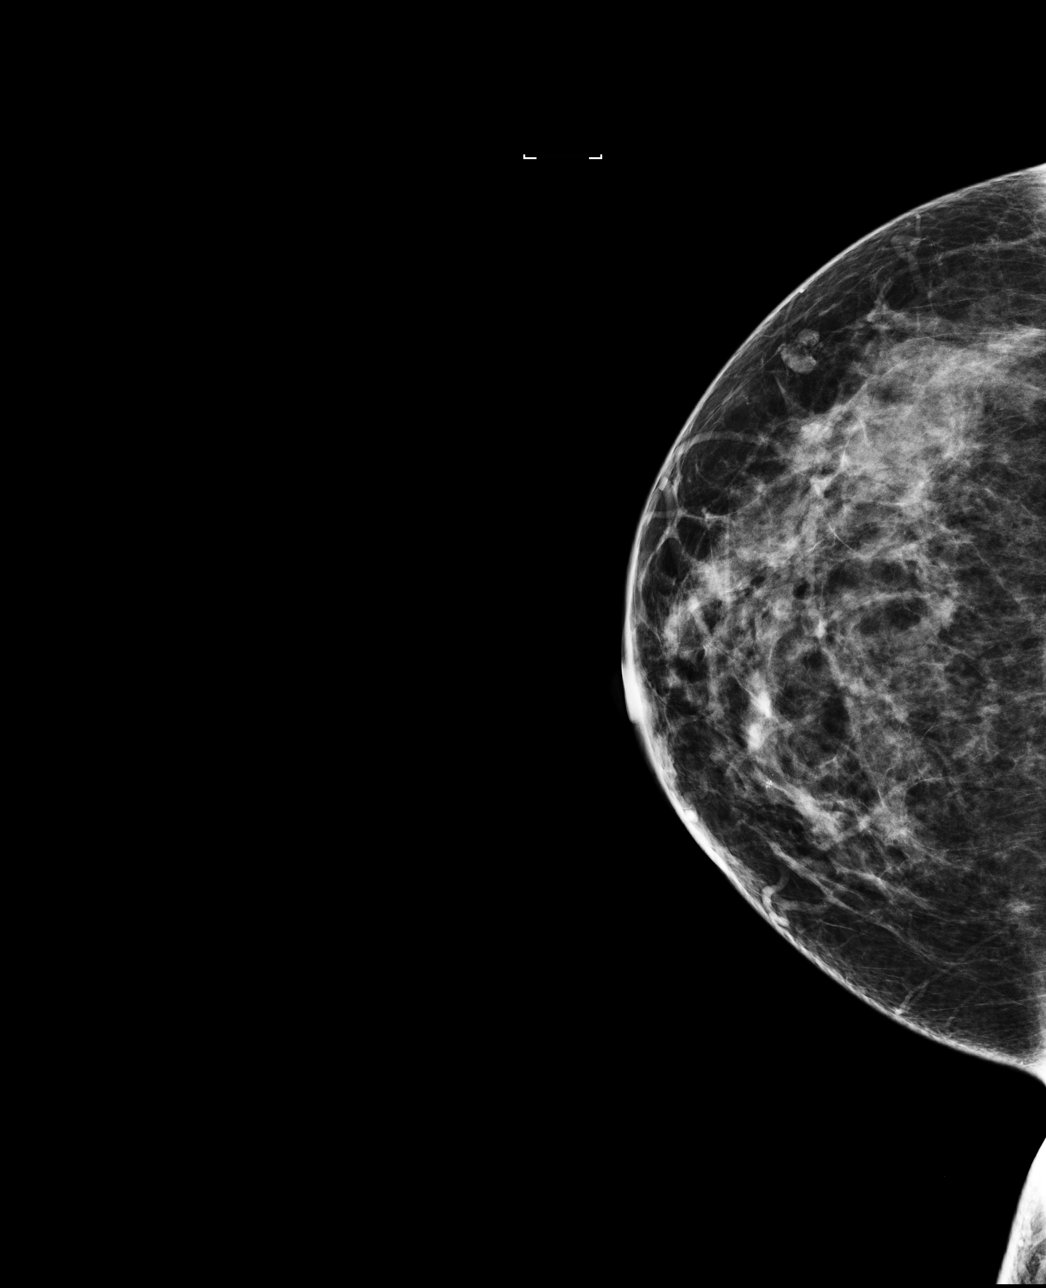

[L MLO]
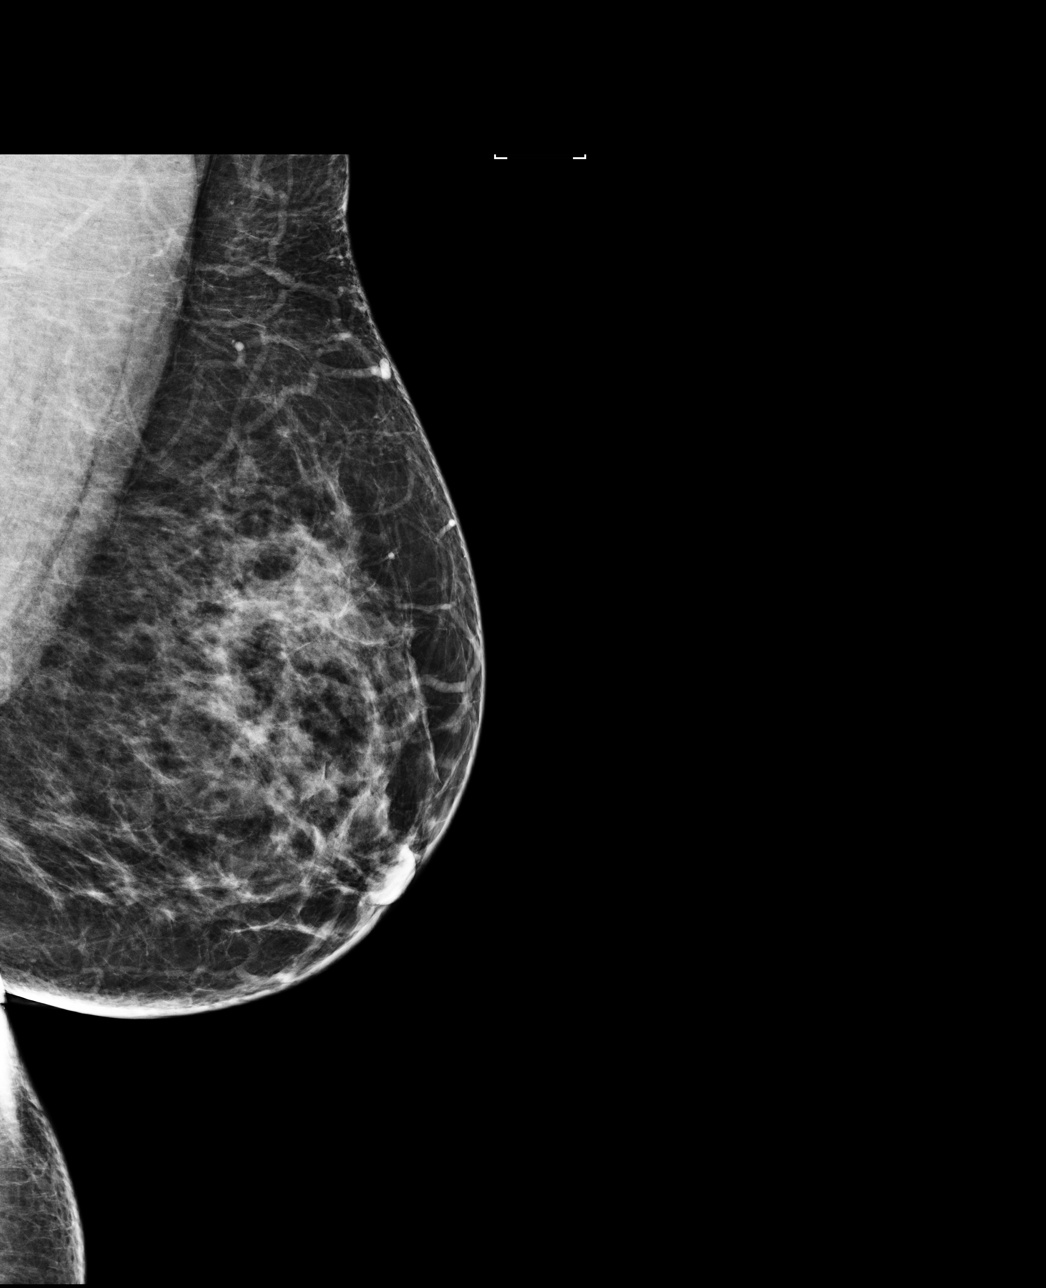

[4 of 4 positions shown; findings below may reference images not displayed]

ACR Breast Density Category c: The breast tissue is heterogeneously
dense, which may obscure small masses.
FINDINGS: There are no findings suspicious for malignancy. Images were
processed with CAD.
IMPRESSION: No mammographic evidence of malignancy. A result letter of this
screening mammogram will be mailed directly to the patient.

RECOMMENDATION:
Screening mammogram in one year. (Code:YJ-[id number])

BI-RADS CATEGORY  1: Negative.

## 2018-12-22 ENCOUNTER — Other Ambulatory Visit: Payer: Self-pay | Admitting: Internal Medicine

## 2019-01-08 ENCOUNTER — Other Ambulatory Visit: Payer: Self-pay | Admitting: Internal Medicine

## 2019-01-09 ENCOUNTER — Other Ambulatory Visit: Payer: Self-pay | Admitting: Internal Medicine

## 2019-02-25 ENCOUNTER — Other Ambulatory Visit: Payer: Self-pay | Admitting: Internal Medicine

## 2019-02-25 DIAGNOSIS — F39 Unspecified mood [affective] disorder: Secondary | ICD-10-CM

## 2019-03-02 ENCOUNTER — Other Ambulatory Visit: Payer: Self-pay

## 2019-03-02 ENCOUNTER — Ambulatory Visit (INDEPENDENT_AMBULATORY_CARE_PROVIDER_SITE_OTHER)
Admission: RE | Admit: 2019-03-02 | Discharge: 2019-03-02 | Disposition: A | Discharge status: Home / Self Care | Payer: 59 | Source: Ambulatory Visit | Attending: Internal Medicine | Admitting: Internal Medicine

## 2019-03-02 ENCOUNTER — Ambulatory Visit (INDEPENDENT_AMBULATORY_CARE_PROVIDER_SITE_OTHER): Payer: 59 | Admitting: Internal Medicine

## 2019-03-02 ENCOUNTER — Encounter: Payer: Self-pay | Admitting: Internal Medicine

## 2019-03-02 VITALS — BP 126/78 | HR 76 | Temp 98.9°F | Wt 184.0 lb

## 2019-03-02 DIAGNOSIS — R202 Paresthesia of skin: Secondary | ICD-10-CM

## 2019-03-02 DIAGNOSIS — M79601 Pain in right arm: Secondary | ICD-10-CM

## 2019-03-02 DIAGNOSIS — M79602 Pain in left arm: Secondary | ICD-10-CM

## 2019-03-02 DIAGNOSIS — M545 Low back pain: Secondary | ICD-10-CM | POA: Diagnosis not present

## 2019-03-02 DIAGNOSIS — G8929 Other chronic pain: Secondary | ICD-10-CM

## 2019-03-02 LAB — VITAMIN D 25 HYDROXY (VIT D DEFICIENCY, FRACTURES): VITD: 21.91 ng/mL — ABNORMAL LOW (ref 30.00–100.00)

## 2019-03-02 LAB — VITAMIN B12: Vitamin B-12: 234 pg/mL (ref 211–911)

## 2019-03-02 MED ORDER — TRAMADOL HCL 50 MG PO TABS: 50.0000 mg | ORAL_TABLET | Freq: Four times a day (QID) | ORAL | 0 refills | Status: AC | PRN

## 2019-03-02 MED ORDER — PREDNISONE 10 MG PO TABS: ORAL_TABLET | ORAL | 0 refills | Status: DC

## 2019-03-02 NOTE — Progress Notes (Signed)
Subjective:    Patient ID: Mallory Meyers, female    DOB: 07/20/1970, 48 y.o.   MRN: [id number]  HPI  Pt presents to the clinic today with c/o bilateral arm pain that has been ongoing for 3 weeks. She states she is unable to make a fist and feels tingling in arms. She describes these as "pins and needles". This is worse in the morning. She denies neck pain or injury to the neck. She has a history of lower back pain and mentions that is starting to act up again. She denies numbness, tingling, weakness or loss of bowel or bladder. She has tried OTC Ibuprofen and Naproxen (2 a day every day) without relief. She works in a new job in Education administrator and has constant bending, twisting with  hand/arm motion. 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 She was started on Letrozole  9/20. She denies any trauma or falls, history of carpal tunnel or known muscle disorder.   Review of Systems      Past Medical History:  Diagnosis Date  . Anemia   . Breast cancer ([place]) 04/2018   RIGHT  . Depression   . Headache   . Hypertension   . Polycystic ovaries   . Seizures ([place])    AS A CHILD    Current Outpatient Medications  Medication Sig Dispense Refill  . ALPRAZolam (XANAX) 0.5 MG tablet Take 1 tablet (0.5 mg total) by mouth every 12 (twelve) hours as needed for anxiety. 12 tablet 0  . buPROPion (WELLBUTRIN XL) 300 MG 24 hr tablet TAKE 1 TABLET BY MOUTH EVERY DAY 90 tablet 0  . butalbital-acetaminophen-caffeine (FIORICET) 50-325-40 MG tablet Take 1-2 tablets by mouth every 6 (six) hours as needed for headache. 45 tablet 2  . cyclobenzaprine (FLEXERIL) 10 MG tablet Take 1 tablet (10 mg total) by mouth daily as needed for muscle spasms. 30 tablet 0  . hydrOXYzine (ATARAX/VISTARIL) 10 MG tablet Take 1 tablet (10 mg total) by mouth daily as needed. 30 tablet 0  . metoprolol succinate (TOPROL-XL) 100 MG 24 hr tablet Take 1 tablet (100 mg total) by mouth daily. MUST SCHEDULE  ANNUAL EXAM FOR REFILLS 90 tablet 3  . Multiple Vitamins-Minerals (ONE-A-DAY WOMENS 50 PLUS PO) Take 1 tablet by mouth daily at 12 noon.    . naproxen (NAPROSYN) 500 MG tablet TAKE 1 TABLET (500 MG TOTAL) BY MOUTH 2 (TWO) TIMES DAILY WITH A MEAL. 180 tablet 3  . sertraline (ZOLOFT) 100 MG tablet Take 1 tablet (100 mg total) by mouth daily. MUST SCHEDULE PHYSICAL 90 tablet 0  . topiramate (TOPAMAX) 25 MG tablet TAKE 4 TABLETS (100 MG TOTAL) BY MOUTH DAILY. 360 tablet 0  . triamterene-hydrochlorothiazide (MAXZIDE-25) 37.5-25 MG tablet TAKE 1 TABLET BY MOUTH DAILY. SCHEDULE PHYSICAL EXAM 90 tablet 0  . Vitamin D, Ergocalciferol, (DRISDOL) 1.25 MG (50000 UT) CAPS capsule Take 1 capsule (50,000 Units total) by mouth every 7 (seven) days. 12 capsule 0  . zolmitriptan (ZOMIG) 5 MG tablet TAKE 1 TABLET (5 MG TOTAL) BY MOUTH ONCE AS NEEDED. MAY REPEAT IN TWO HOURS AS NEEDED 6 tablet 11  . zolpidem (AMBIEN) 5 MG tablet Take 1 tablet (5 mg total) by mouth at bedtime as needed for sleep. (Patient not taking: Reported on 08/17/2018) 30 tablet 0   No current facility-administered medications for this visit.     No Known Allergies  Family History  Problem Relation Age of Onset  . Lung  disease Mother   . Hypertension Mother   . Kidney disease Mother   . Diabetes Mother   . Colon polyps Mother        colon resection  . Hypertension Maternal Grandmother   . Breast cancer Neg Hx   . Colon cancer Neg Hx     Social History   Socioeconomic History  . Marital status: Legally Separated    Spouse name: Not on file  . Number of children: Not on file  . Years of education: Not on file  . Highest education level: Not on file  Occupational History  . Not on file  Social Needs  . Financial resource strain: Not on file  . Food insecurity    Worry: Not on file    Inability: Not on file  . Transportation needs    Medical: Not on file    Non-medical: Not on file  Tobacco Use  . Smoking status: Never  Smoker  . Smokeless tobacco: Never Used  Substance and Sexual Activity  . Alcohol use: Yes    Alcohol/week: 0.0 standard drinks    Comment: occasional  . Drug use: No  . Sexual activity: Yes    Birth control/protection: None  Lifestyle  . Physical activity    Days per week: Not on file    Minutes per session: Not on file  . Stress: Not on file  Relationships  . Social Herbalist on phone: Not on file    Gets together: Not on file    Attends religious service: Not on file    Active member of club or organization: Not on file    Attends meetings of clubs or organizations: Not on file    Relationship status: Not on file  . Intimate partner violence    Fear of current or ex partner: Not on file    Emotionally abused: Not on file    Physically abused: Not on file    Forced sexual activity: Not on file  Other Topics Concern  . Not on file  Social History Narrative  . Not on file     Constitutional: Denies fever, malaise, fatigue, headache or abrupt weight changes. Marland Kitchen Respiratory: Denies difficulty breathing, shortness of breath, cough or sputum production.   Cardiovascular: Denies chest pain, chest tightness, palpitations or swelling in the hands or feet.  Musculoskeletal: Pt reports decreased grip strength to hands, chronic low back pain. Denies decrease in range of motion, difficulty with gait, or joint pain and swelling.  Neurological: Pt reports tingling/pins and needles to upper extremities. Denies dizziness, difficulty with memory, difficulty with speech or problems with balance and coordination.    No other specific complaints in a complete review of systems (except as listed in HPI above).  Objective:   Physical Exam  BP 126/78   Pulse 76   Temp 98.9 F (37.2 C) (Temporal)   Wt 184 lb (83.5 kg)   SpO2 100%   BMI 35.94 kg/m   General: Appears her stated age, obese in NAD. Musculoskeletal: Normal flexion, extension and rotation of the  spine. Mild bony  tenderness noted over the cervical and lumbar spine. Strength 4/5 BUE. Strength 5/5 BLE. Hand grips equal. No difficulty with gait.  Neurologic: Alert and oriented. Neg Tinnel and Phalen signs. Coordination normal.      Assessment & Plan:  Bilateral Upper Extremity Paresthesia:  Ordered cervical x-rays to r/o osseous abnormality May need MRI of cervical spine if symptoms persist. Will check  Vit D and B12  RX for  Pred 9 day taper to help with inflammation (avoid other NSAID's OTC) RX for Ultram 50 mg Q6H prn for severe pain Encouraged neck exercises  Chronic Low Back Pain:  Discussed how weight loss can help improve back pain Encouraged daily stretching Heat and massage may be helpful Continue Naproxen prn- when not on Prednisone RX for Ultram 50 mg Q6H prn for severe pain  Will follow up after labs and xrays, return precautions discussed Webb Silversmith, NP

## 2019-03-02 NOTE — Patient Instructions (Signed)
Paresthesia Paresthesia is a burning or prickling feeling. This feeling can happen in any part of the body. It often happens in the hands, arms, legs, or feet. Usually, it is not painful. In most cases, the feeling goes away in a short time and is not a sign of a serious problem. If you have paresthesia that lasts a long time, you may need to be seen by your doctor. Follow these instructions at home: Alcohol use   Do not drink alcohol if: ? Your doctor tells you not to drink. ? You are pregnant, may be pregnant, or are planning to become pregnant.  If you drink alcohol: ? Limit how much you use to:  0-1 drink a day for women.  0-2 drinks a day for men. ? Be aware of how much alcohol is in your drink. In the [place] one drink equals one 12 oz bottle of beer (355 mL), one 5 oz glass of wine (148 mL), or one 1 oz glass of hard liquor (44 mL). Nutrition   Eat a healthy diet. This includes: ? Eating foods that have a lot of fiber in them, such as fresh fruits and vegetables, whole grains, and beans. ? Limiting foods that have a lot of fat and processed sugars in them, such as fried or sweet foods. General instructions  Take over-the-counter and prescription medicines only as told by your doctor.  Do not use any products that have nicotine or tobacco in them, such as cigarettes and e-cigarettes. If you need help quitting, ask your doctor.  If you have diabetes, work with your doctor to make sure your blood sugar stays in a healthy range.  If your feet feel numb: ? Check for redness, warmth, and swelling every day. ? Wear padded socks and comfortable shoes. These help protect your feet.  Keep all follow-up visits as told by your doctor. This is important. Contact a doctor if:  You have paresthesia that gets worse or does not go away.  Your burning or prickling feeling gets worse when you walk.  You have pain or cramps.  You feel dizzy.  You have a rash. Get help right away if  you:  Feel weak.  Have trouble walking or moving.  Have problems speaking, understanding, or seeing.  Feel confused.  Cannot control when you pee (urinate) or poop (have a bowel movement).  Lose feeling (have numbness) after an injury.  Have new weakness in an arm or leg.  Pass out (faint). Summary  Paresthesia is a burning or prickling feeling. It often happens in the hands, arms, legs, or feet.  In most cases, the feeling goes away in a short time and is not a sign of a serious problem.  If you have paresthesia that lasts a long time, you may need to be seen by your doctor. This information is not intended to replace advice given to you by your health care provider. Make sure you discuss any questions you have with your health care provider. Document Released: 03/28/2008 Document Revised: 05/11/2018 Document Reviewed: 04/24/2017 Elsevier Patient Education  2020 [employer] Inc[employer]  

## 2019-03-10 ENCOUNTER — Encounter: Payer: Self-pay | Admitting: Internal Medicine

## 2019-03-10 MED ORDER — VITAMIN D (ERGOCALCIFEROL) 1.25 MG (50000 UNIT) PO CAPS: 50000.0000 [IU] | ORAL_CAPSULE | ORAL | 0 refills | Status: DC

## 2019-03-22 ENCOUNTER — Other Ambulatory Visit: Payer: Self-pay | Admitting: Internal Medicine

## 2019-03-30 ENCOUNTER — Ambulatory Visit (INDEPENDENT_AMBULATORY_CARE_PROVIDER_SITE_OTHER): Payer: 59

## 2019-03-30 ENCOUNTER — Ambulatory Visit: Payer: 59

## 2019-03-30 ENCOUNTER — Other Ambulatory Visit: Payer: Self-pay

## 2019-03-30 DIAGNOSIS — E538 Deficiency of other specified B group vitamins: Secondary | ICD-10-CM

## 2019-03-30 MED ORDER — CYANOCOBALAMIN 1000 MCG/ML IJ SOLN
1000.0000 ug | Freq: Once | INTRAMUSCULAR | Status: AC
  Administered 2019-03-30: 1000 ug via INTRAMUSCULAR

## 2019-03-30 NOTE — Progress Notes (Signed)
Pt received 1st q 2 weeks for 2 month B12 injection. Tolerated well. She has an OV in 2 weeks and can get it then.

## 2019-04-14 ENCOUNTER — Ambulatory Visit (INDEPENDENT_AMBULATORY_CARE_PROVIDER_SITE_OTHER)
Admission: RE | Admit: 2019-04-14 | Discharge: 2019-04-14 | Disposition: A | Discharge status: Home / Self Care | Payer: Managed Care, Other (non HMO) | Source: Ambulatory Visit | Attending: Internal Medicine | Admitting: Internal Medicine

## 2019-04-14 ENCOUNTER — Encounter: Payer: Self-pay | Admitting: Internal Medicine

## 2019-04-14 ENCOUNTER — Other Ambulatory Visit: Payer: Self-pay

## 2019-04-14 ENCOUNTER — Ambulatory Visit (INDEPENDENT_AMBULATORY_CARE_PROVIDER_SITE_OTHER): Payer: Managed Care, Other (non HMO) | Admitting: Internal Medicine

## 2019-04-14 VITALS — BP 120/82 | HR 99 | Temp 98.7°F | Ht 60.0 in | Wt 192.0 lb

## 2019-04-14 DIAGNOSIS — F419 Anxiety disorder, unspecified: Secondary | ICD-10-CM

## 2019-04-14 DIAGNOSIS — I10 Essential (primary) hypertension: Secondary | ICD-10-CM | POA: Diagnosis not present

## 2019-04-14 DIAGNOSIS — Z0000 Encounter for general adult medical examination without abnormal findings: Secondary | ICD-10-CM | POA: Diagnosis not present

## 2019-04-14 DIAGNOSIS — M545 Low back pain, unspecified: Secondary | ICD-10-CM

## 2019-04-14 DIAGNOSIS — F5101 Primary insomnia: Secondary | ICD-10-CM | POA: Diagnosis not present

## 2019-04-14 DIAGNOSIS — G8929 Other chronic pain: Secondary | ICD-10-CM

## 2019-04-14 DIAGNOSIS — G43C1 Periodic headache syndromes in child or adult, intractable: Secondary | ICD-10-CM

## 2019-04-14 DIAGNOSIS — F329 Major depressive disorder, single episode, unspecified: Secondary | ICD-10-CM

## 2019-04-14 DIAGNOSIS — E538 Deficiency of other specified B group vitamins: Secondary | ICD-10-CM

## 2019-04-14 DIAGNOSIS — C50911 Malignant neoplasm of unspecified site of right female breast: Secondary | ICD-10-CM

## 2019-04-14 MED ORDER — HYDROXYZINE HCL 10 MG PO TABS: 10.0000 mg | ORAL_TABLET | Freq: Every day | ORAL | 0 refills | Status: DC | PRN

## 2019-04-14 MED ORDER — CYCLOBENZAPRINE HCL 10 MG PO TABS: 10.0000 mg | ORAL_TABLET | Freq: Every day | ORAL | 0 refills | Status: DC | PRN

## 2019-04-14 MED ORDER — BUPROPION HCL ER (XL) 150 MG PO TB24: 150.0000 mg | ORAL_TABLET | Freq: Every day | ORAL | 2 refills | Status: DC

## 2019-04-14 MED ORDER — SERTRALINE HCL 100 MG PO TABS: 100.0000 mg | ORAL_TABLET | Freq: Every day | ORAL | 0 refills | Status: DC

## 2019-04-14 MED ORDER — LETROZOLE 2.5 MG PO TABS: 2.5000 mg | ORAL_TABLET | Freq: Every day | ORAL | 3 refills | Status: AC

## 2019-04-14 MED ORDER — GABAPENTIN 300 MG PO CAPS: 300.0000 mg | ORAL_CAPSULE | Freq: Three times a day (TID) | ORAL | 11 refills | Status: AC

## 2019-04-14 MED ORDER — TOPIRAMATE 25 MG PO TABS: 100.0000 mg | ORAL_TABLET | Freq: Every day | ORAL | 0 refills | Status: DC

## 2019-04-14 MED ORDER — METOPROLOL SUCCINATE ER 100 MG PO TB24: 100.0000 mg | ORAL_TABLET | Freq: Every day | ORAL | 3 refills | Status: AC

## 2019-04-14 MED ORDER — BUTALBITAL-APAP-CAFFEINE 50-325-40 MG PO TABS: 1.0000 | ORAL_TABLET | Freq: Four times a day (QID) | ORAL | 2 refills | Status: AC | PRN

## 2019-04-14 MED ORDER — CYANOCOBALAMIN 1000 MCG/ML IJ SOLN
1000.0000 ug | Freq: Once | INTRAMUSCULAR | Status: AC
  Administered 2019-04-14: 16:00:00 1000 ug via INTRAMUSCULAR

## 2019-04-14 MED ORDER — TRAMADOL HCL 50 MG PO TABS: 100.0000 mg | ORAL_TABLET | Freq: Every day | ORAL | 0 refills | Status: DC | PRN

## 2019-04-14 NOTE — Assessment & Plan Note (Signed)
In remission- congratulated her on this Continue Letrazole She will continue to follow with oncology

## 2019-04-14 NOTE — Assessment & Plan Note (Signed)
Improved Will d/c Ambien

## 2019-04-14 NOTE — Progress Notes (Signed)
Subjective:    Patient ID: Mallory Meyers, female    DOB: Nov 07, 1970, 48 y.o.   MRN: [id number]  HPI  Pt presents to the clinic today for her annual exam. She is also due to follow up chronic conditions.  Right Breast Cancer: s/p lumpectomy, chemo and radiation. She is taking Letrozole as prescribed. She follows with [facility].  Migraines: These occur 1-2 times month. Managed on Topamax, Fioricet and Zomig. She does not follow with neurology.  Anxiety and Depression: Chronic but improving. Managed on Sertraline, Wellbutrin, Hydroxyzine and Xanax. She does not see a therapist. She denies SI/HI. There is no CSA or UDS on file.   HTN: Her BP today is 120/82. She is taking Metoprolol and Triamterene HCT as prescribed. ECG from 11/2014 reviewed.  Insomnia: She is currently sleeping well. She is not taking Ambien as needed anymore.There is no sleep study on file.  Chronic Low Back Pain: Worse lately. Managed on Gabapentin and Flexeril. She take Tramadol as needed for severe pain. She recently got a new job where she is doing more lifting. She does not stretch routinely.  Flu: 12/2018 Tetanus: 08/2014 Pap Smear: 08/2016 Mammogram: 12/2018 Colon Screening: 08/2016 Vision Screening: annually Dentist: biannually  Diet: She does eat meat. She consumes fruits and veggies. She occassionally eats fried foods. She drinks mostly water. Exercise: None   Review of Systems      Past Medical History:  Diagnosis Date  . Anemia   . Breast cancer ([place]) 04/2018   RIGHT  . Depression   . Headache   . Hypertension   . Polycystic ovaries   . Seizures ([place])    AS A CHILD    Current Outpatient Medications  Medication Sig Dispense Refill  . ALPRAZolam (XANAX) 0.5 MG tablet Take 1 tablet (0.5 mg total) by mouth every 12 (twelve) hours as needed for anxiety. 12 tablet 0  . buPROPion (WELLBUTRIN XL) 300 MG 24 hr tablet TAKE 1 TABLET BY MOUTH EVERY DAY 90 tablet 0  .  butalbital-acetaminophen-caffeine (FIORICET) 50-325-40 MG tablet Take 1-2 tablets by mouth every 6 (six) hours as needed for headache. 45 tablet 2  . cyclobenzaprine (FLEXERIL) 10 MG tablet Take 1 tablet (10 mg total) by mouth daily as needed for muscle spasms. 30 tablet 0  . hydrOXYzine (ATARAX/VISTARIL) 10 MG tablet Take 1 tablet (10 mg total) by mouth daily as needed. 30 tablet 0  . letrozole (FEMARA) 2.5 MG tablet Take by mouth.    . metoprolol succinate (TOPROL-XL) 100 MG 24 hr tablet Take 1 tablet (100 mg total) by mouth daily. MUST SCHEDULE ANNUAL EXAM FOR REFILLS 90 tablet 3  . Multiple Vitamins-Minerals (ONE-A-DAY WOMENS 50 PLUS PO) Take 1 tablet by mouth daily at 12 noon.    . naproxen (NAPROSYN) 500 MG tablet TAKE 1 TABLET (500 MG TOTAL) BY MOUTH 2 (TWO) TIMES DAILY WITH A MEAL. 180 tablet 3  . predniSONE (DELTASONE) 10 MG tablet Take 3 tabs on days 1-3, take 2 tabs on days 4-6, take 1 tab on days 7-9 18 tablet 0  . sertraline (ZOLOFT) 100 MG tablet Take 1 tablet (100 mg total) by mouth daily. MUST SCHEDULE PHYSICAL 90 tablet 0  . topiramate (TOPAMAX) 25 MG tablet TAKE 4 TABLETS (100 MG TOTAL) BY MOUTH DAILY. 360 tablet 0  . triamterene-hydrochlorothiazide (MAXZIDE-25) 37.5-25 MG tablet Take 1 tablet by mouth daily. 90 tablet 0  . Vitamin D, Ergocalciferol, (DRISDOL) 1.25 MG (50000 UT) CAPS capsule Take 1 capsule (  50,000 Units total) by mouth every 7 (seven) days. 12 capsule 0  . zolmitriptan (ZOMIG) 5 MG tablet TAKE 1 TABLET (5 MG TOTAL) BY MOUTH ONCE AS NEEDED. MAY REPEAT IN TWO HOURS AS NEEDED 6 tablet 11  . zolpidem (AMBIEN) 5 MG tablet Take 1 tablet (5 mg total) by mouth at bedtime as needed for sleep. 30 tablet 0   No current facility-administered medications for this visit.    No Known Allergies  Family History  Problem Relation Age of Onset  . Lung disease Mother   . Hypertension Mother   . Kidney disease Mother   . Diabetes Mother   . Colon polyps Mother        colon  resection  . Hypertension Maternal Grandmother   . Breast cancer Neg Hx   . Colon cancer Neg Hx     Social History   Socioeconomic History  . Marital status: Legally Separated    Spouse name: Not on file  . Number of children: Not on file  . Years of education: Not on file  . Highest education level: Not on file  Occupational History  . Not on file  Tobacco Use  . Smoking status: Never Smoker  . Smokeless tobacco: Never Used  Substance and Sexual Activity  . Alcohol use: Yes    Alcohol/week: 0.0 standard drinks    Comment: occasional  . Drug use: No  . Sexual activity: Yes    Birth control/protection: None  Other Topics Concern  . Not on file  Social History Narrative  . Not on file   Social Determinants of Health   Financial Resource Strain:   . Difficulty of Paying Living Expenses: Not on file  Food Insecurity:   . Worried About Charity fundraiser in the Last Year: Not on file  . Ran Out of Food in the Last Year: Not on file  Transportation Needs:   . Lack of Transportation (Medical): Not on file  . Lack of Transportation (Non-Medical): Not on file  Physical Activity:   . Days of Exercise per Week: Not on file  . Minutes of Exercise per Session: Not on file  Stress:   . Feeling of Stress : Not on file  Social Connections:   . Frequency of Communication with Friends and Family: Not on file  . Frequency of Social Gatherings with Friends and Family: Not on file  . Attends Religious Services: Not on file  . Active Member of Clubs or Organizations: Not on file  . Attends Archivist Meetings: Not on file  . Marital Status: Not on file  Intimate Partner Violence:   . Fear of Current or Ex-Partner: Not on file  . Emotionally Abused: Not on file  . Physically Abused: Not on file  . Sexually Abused: Not on file     Constitutional: Pt reports intermittent headaches. Denies fever, malaise, fatigue, or abrupt weight changes.  HEENT: Denies eye pain, eye  redness, ear pain, ringing in the ears, wax buildup, runny nose, nasal congestion, bloody nose, or sore throat. Respiratory: Denies difficulty breathing, shortness of breath, cough or sputum production.   Cardiovascular: Denies chest pain, chest tightness, palpitations or swelling in the hands or feet.  Gastrointestinal: Denies abdominal pain, bloating, constipation, diarrhea or blood in the stool.  GU: Denies urgency, frequency, pain with urination, burning sensation, blood in urine, odor or discharge. Musculoskeletal: Pt reports chronic low back pain. Denies decrease in range of motion, difficulty with gait, or joint  swelling.  Skin: Denies redness, rashes, lesions or ulcercations.  Neurological: Denies dizziness, difficulty with memory, difficulty with speech or problems with balance and coordination.  Psych: Pt has a history of anxiety and depression. Denies SI/HI.  No other specific complaints in a complete review of systems (except as listed in HPI above).  Objective:   Physical Exam   BP 120/82   Pulse 99   Temp 98.7 F (37.1 C) (Temporal)   Ht 5' (1.524 m)   Wt 192 lb (87.1 kg)   SpO2 98%   BMI 37.50 kg/m   Wt Readings from Last 3 Encounters:  03/02/19 184 lb (83.5 kg)  06/11/18 206 lb (93.4 kg)  06/03/18 208 lb (94.3 kg)    General: Appears her stated age, obese, in NAD. Skin: Warm, dry and intact. No rashes, lesions or ulcerations noted. HEENT: Head: normal shape and size; Eyes: sclera white, no icterus, conjunctiva pink, PERRLA and EOMs intact;  Neck:  Neck supple, trachea midline. No masses, lumps or thyromegaly present.  Cardiovascular: Normal rate and rhythm. S1,S2 noted.  No murmur, rubs or gallops noted. Trace BLE edema.  Pulmonary/Chest: Normal effort and positive vesicular breath sounds. No respiratory distress. No wheezes, rales or ronchi noted.  Abdomen: Soft and nontender. Normal bowel sounds. No distention or masses noted. Liver, spleen and kidneys non  palpable. Musculoskeletal: Normal flexion, extension and rotation of the spine but with pain. No bony tenderness noted over the spine. Bilateral paralumbar tenderness noted. Strength 5/5 BUE/BLE. No difficulty with gait.  Neurological: Alert and oriented. Cranial nerves II-XII grossly intact. Coordination normal.  Psychiatric: Mood and affect normal. Behavior is normal. Judgment and thought content normal.    BMET    Component Value Date/Time   NA 138 12/15/2017 1234   NA 136 06/16/2015 1442   NA 137 08/15/2012 0949   K 3.7 12/15/2017 1234   K 3.8 08/15/2012 0949   CL 104 12/15/2017 1234   CL 104 08/15/2012 0949   CO2 26 12/15/2017 1234   CO2 26 08/15/2012 0949   GLUCOSE 90 12/15/2017 1234   GLUCOSE 92 08/15/2012 0949   BUN 14 12/15/2017 1234   BUN 15 06/16/2015 1442   BUN 16 08/15/2012 0949   CREATININE 1.02 12/15/2017 1234   CREATININE 0.89 08/15/2012 0949   CALCIUM 9.0 12/15/2017 1234   CALCIUM 9.0 08/15/2012 0949   GFRNONAA 74 06/16/2015 1442   GFRNONAA >60 08/15/2012 0949   GFRAA 85 06/16/2015 1442   GFRAA >60 08/15/2012 0949    Lipid Panel     Component Value Date/Time   CHOL 158 12/15/2017 1234   TRIG 157.0 (H) 12/15/2017 1234   HDL 46.90 12/15/2017 1234   CHOLHDL 3 12/15/2017 1234   VLDL 31.4 12/15/2017 1234   LDLCALC 80 12/15/2017 1234    CBC    Component Value Date/Time   WBC 7.4 12/15/2017 1234   RBC 5.04 12/15/2017 1234   HGB 13.5 12/15/2017 1234   HGB 13.6 06/16/2015 1442   HCT 41.0 12/15/2017 1234   HCT 40.4 06/16/2015 1442   PLT 257.0 12/15/2017 1234   PLT 302 06/16/2015 1442   MCV 81.4 12/15/2017 1234   MCV 82 06/16/2015 1442   MCV 82 08/15/2012 0949   MCH 27.5 06/16/2015 1442   MCH 27.4 12/22/2014 1052   MCHC 33.0 12/15/2017 1234   RDW 12.8 12/15/2017 1234   RDW 13.0 06/16/2015 1442   RDW 12.7 08/15/2012 0949   LYMPHSABS 2.0 12/22/2014 1052   MONOABS  0.4 12/22/2014 1052   EOSABS 0.2 12/22/2014 1052   BASOSABS 0.1 12/22/2014 1052     Hgb A1C Lab Results  Component Value Date   HGBA1C 5.8 07/09/2016           Assessment & Plan:   Preventative Health Maintnenace:  Flu shot UTD Tetanus UTD Pap smear UTD Mammogram UTD Colon screening UTD Encouraged her to consume a balanced diet and exercise regimen Advised her to see an eye doctor and dentist annually Will check CBC, CMET, Lipid today  RTC in 1 year, sooner if needed Webb Silversmith, NP This visit occurred during the SARS-CoV-2 public health emergency.  Safety protocols were in place, including screening questions prior to the visit, additional usage of staff PPE, and extensive cleaning of exam room while observing appropriate contact time as indicated for disinfecting solutions.

## 2019-04-14 NOTE — Addendum Note (Signed)
Addended by: Lurlean Nanny on: 04/14/2019 04:20 PM   Modules accepted: Orders

## 2019-04-14 NOTE — Assessment & Plan Note (Signed)
Improving Continue Sertraline Decrease Wellbutrin to 150 mg daily Continue Hydroxyzine prn D/C Xanax

## 2019-04-14 NOTE — Assessment & Plan Note (Signed)
Continue Metoprolol and Triamterene HCT CMET today Reinforced DASH diet and exercise for weight loss

## 2019-04-14 NOTE — Patient Instructions (Signed)
Health Maintenance, Female Adopting a healthy lifestyle and getting preventive care are important in promoting health and wellness. Ask your health care provider about:  The right schedule for you to have regular tests and exams.  Things you can do on your own to prevent diseases and keep yourself healthy. What should I know about diet, weight, and exercise? Eat a healthy diet   Eat a diet that includes plenty of vegetables, fruits, low-fat dairy products, and lean protein.  Do not eat a lot of foods that are high in solid fats, added sugars, or sodium. Maintain a healthy weight Body mass index (BMI) is used to identify weight problems. It estimates body fat based on height and weight. Your health care provider can help determine your BMI and help you achieve or maintain a healthy weight. Get regular exercise Get regular exercise. This is one of the most important things you can do for your health. Most adults should:  Exercise for at least 150 minutes each week. The exercise should increase your heart rate and make you sweat (moderate-intensity exercise).  Do strengthening exercises at least twice a week. This is in addition to the moderate-intensity exercise.  Spend less time sitting. Even light physical activity can be beneficial. Watch cholesterol and blood lipids Have your blood tested for lipids and cholesterol at 49 years of age, then have this test every 5 years. Have your cholesterol levels checked more often if:  Your lipid or cholesterol levels are high.  You are older than 49 years of age.  You are at high risk for heart disease. What should I know about cancer screening? Depending on your health history and family history, you may need to have cancer screening at various ages. This may include screening for:  Breast cancer.  Cervical cancer.  Colorectal cancer.  Skin cancer.  Lung cancer. What should I know about heart disease, diabetes, and high blood  pressure? Blood pressure and heart disease  High blood pressure causes heart disease and increases the risk of stroke. This is more likely to develop in people who have high blood pressure readings, are of African descent, or are overweight.  Have your blood pressure checked: ? Every 3-5 years if you are 18-39 years of age. ? Every year if you are 40 years old or older. Diabetes Have regular diabetes screenings. This checks your fasting blood sugar level. Have the screening done:  Once every three years after age 40 if you are at a normal weight and have a low risk for diabetes.  More often and at a younger age if you are overweight or have a high risk for diabetes. What should I know about preventing infection? Hepatitis B If you have a higher risk for hepatitis B, you should be screened for this virus. Talk with your health care provider to find out if you are at risk for hepatitis B infection. Hepatitis C Testing is recommended for:  Everyone born from 1945 through 1965.  Anyone with known risk factors for hepatitis C. Sexually transmitted infections (STIs)  Get screened for STIs, including gonorrhea and chlamydia, if: ? You are sexually active and are younger than 49 years of age. ? You are older than 49 years of age and your health care provider tells you that you are at risk for this type of infection. ? Your sexual activity has changed since you were last screened, and you are at increased risk for chlamydia or gonorrhea. Ask your health care provider if   you are at risk.  Ask your health care provider about whether you are at high risk for HIV. Your health care provider may recommend a prescription medicine to help prevent HIV infection. If you choose to take medicine to prevent HIV, you should first get tested for HIV. You should then be tested every 3 months for as long as you are taking the medicine. Pregnancy  If you are about to stop having your period (premenopausal) and  you may become pregnant, seek counseling before you get pregnant.  Take 400 to 800 micrograms (mcg) of folic acid every day if you become pregnant.  Ask for birth control (contraception) if you want to prevent pregnancy. Osteoporosis and menopause Osteoporosis is a disease in which the bones lose minerals and strength with aging. This can result in bone fractures. If you are 65 years old or older, or if you are at risk for osteoporosis and fractures, ask your health care provider if you should:  Be screened for bone loss.  Take a calcium or vitamin D supplement to lower your risk of fractures.  Be given hormone replacement therapy (HRT) to treat symptoms of menopause. Follow these instructions at home: Lifestyle  Do not use any products that contain nicotine or tobacco, such as cigarettes, e-cigarettes, and chewing tobacco. If you need help quitting, ask your health care provider.  Do not use street drugs.  Do not share needles.  Ask your health care provider for help if you need support or information about quitting drugs. Alcohol use  Do not drink alcohol if: ? Your health care provider tells you not to drink. ? You are pregnant, may be pregnant, or are planning to become pregnant.  If you drink alcohol: ? Limit how much you use to 0-1 drink a day. ? Limit intake if you are breastfeeding.  Be aware of how much alcohol is in your drink. In the [place] one drink equals one 12 oz bottle of beer (355 mL), one 5 oz glass of wine (148 mL), or one 1 oz glass of hard liquor (44 mL). General instructions  Schedule regular health, dental, and eye exams.  Stay current with your vaccines.  Tell your health care provider if: ? You often feel depressed. ? You have ever been abused or do not feel safe at home. Summary  Adopting a healthy lifestyle and getting preventive care are important in promoting health and wellness.  Follow your health care provider's instructions about healthy  diet, exercising, and getting tested or screened for diseases.  Follow your health care provider's instructions on monitoring your cholesterol and blood pressure. This information is not intended to replace advice given to you by your health care provider. Make sure you discuss any questions you have with your health care provider. Document Released: 10/29/2010 Document Revised: 04/08/2018 Document Reviewed: 04/08/2018 Elsevier Patient Education  2020 [employer]  

## 2019-04-14 NOTE — Assessment & Plan Note (Signed)
Improving due to menopause Continue Topamax, Fioricet and Zomig Consider weaning Topamax in 3 months

## 2019-04-14 NOTE — Assessment & Plan Note (Signed)
Deteriorated Xray lumbar spine today Encouraged her to stretch regularly Continue Gabapentin, Flexeril and Tramadol Consider PT vs MRI pending xray

## 2019-04-15 LAB — COMPREHENSIVE METABOLIC PANEL
ALT: 16 U/L (ref 0–35)
AST: 16 U/L (ref 0–37)
Albumin: 3.9 g/dL (ref 3.5–5.2)
Alkaline Phosphatase: 58 U/L (ref 39–117)
BUN: 16 mg/dL (ref 6–23)
CO2: 31 mEq/L (ref 19–32)
Calcium: 9.2 mg/dL (ref 8.4–10.5)
Chloride: 106 mEq/L (ref 96–112)
Creatinine, Ser: 1.12 mg/dL (ref 0.40–1.20)
GFR: 62.43 mL/min (ref >60.00)
Glucose, Bld: 81 mg/dL (ref 70–99)
Potassium: 4 mEq/L (ref 3.5–5.1)
Sodium: 141 mEq/L (ref 135–145)
Total Bilirubin: 0.3 mg/dL (ref 0.2–1.2)
Total Protein: 6.6 g/dL (ref 6.0–8.3)

## 2019-04-15 LAB — LIPID PANEL
Cholesterol: 183 mg/dL (ref 0–200)
HDL: 41.1 mg/dL (ref >39.00)
LDL Cholesterol: 115 mg/dL — ABNORMAL HIGH (ref 0–99)
NonHDL: 141.95
Total CHOL/HDL Ratio: 4
Triglycerides: 134 mg/dL (ref 0.0–149.0)
VLDL: 26.8 mg/dL (ref 0.0–40.0)

## 2019-04-15 LAB — CBC
HCT: 40.6 % (ref 36.0–46.0)
Hemoglobin: 13.1 g/dL (ref 12.0–15.0)
MCHC: 32.4 g/dL (ref 30.0–36.0)
MCV: 82.2 fl (ref 78.0–100.0)
Platelets: 253 10*3/uL (ref 150.0–400.0)
RBC: 4.94 Mil/uL (ref 3.87–5.11)
RDW: 13.3 % (ref 11.5–15.5)
WBC: 5.3 10*3/uL (ref 4.0–10.5)

## 2019-04-17 ENCOUNTER — Other Ambulatory Visit: Payer: Self-pay | Admitting: Internal Medicine

## 2019-04-28 ENCOUNTER — Other Ambulatory Visit: Payer: Self-pay

## 2019-04-28 ENCOUNTER — Ambulatory Visit (INDEPENDENT_AMBULATORY_CARE_PROVIDER_SITE_OTHER): Payer: Managed Care, Other (non HMO) | Admitting: *Deleted

## 2019-04-28 DIAGNOSIS — E538 Deficiency of other specified B group vitamins: Secondary | ICD-10-CM

## 2019-04-28 MED ORDER — CYANOCOBALAMIN 1000 MCG/ML IJ SOLN
1000.0000 ug | Freq: Once | INTRAMUSCULAR | Status: AC
  Administered 2019-04-28: 1000 ug via INTRAMUSCULAR

## 2019-04-28 NOTE — Progress Notes (Signed)
Per orders of Regina Baity, NP, injection of B12 1000 mcg/mL given by [place]. Patient tolerated injection well.  

## 2019-05-10 ENCOUNTER — Other Ambulatory Visit: Payer: Self-pay | Admitting: Internal Medicine

## 2019-05-10 DIAGNOSIS — F329 Major depressive disorder, single episode, unspecified: Secondary | ICD-10-CM

## 2019-05-10 DIAGNOSIS — F419 Anxiety disorder, unspecified: Secondary | ICD-10-CM

## 2019-05-10 NOTE — Telephone Encounter (Signed)
Pt requesting for 90 day supply, please advise if appropriate

## 2019-05-12 ENCOUNTER — Other Ambulatory Visit: Payer: Self-pay

## 2019-05-12 ENCOUNTER — Ambulatory Visit (INDEPENDENT_AMBULATORY_CARE_PROVIDER_SITE_OTHER): Payer: No Typology Code available for payment source

## 2019-05-12 ENCOUNTER — Ambulatory Visit: Payer: Managed Care, Other (non HMO)

## 2019-05-12 DIAGNOSIS — E538 Deficiency of other specified B group vitamins: Secondary | ICD-10-CM | POA: Diagnosis not present

## 2019-05-12 MED ORDER — CYANOCOBALAMIN 1000 MCG/ML IJ SOLN
1000.0000 ug | Freq: Once | INTRAMUSCULAR | Status: AC
  Administered 2019-05-12: 1000 ug via INTRAMUSCULAR

## 2019-05-12 NOTE — Progress Notes (Signed)
Per orders of [facility], NP-C, injection of B12 1077mcg given by Lurlean Nanny. Patient tolerated injection well.

## 2019-05-28 ENCOUNTER — Other Ambulatory Visit: Payer: Self-pay | Admitting: Internal Medicine

## 2019-05-28 DIAGNOSIS — F39 Unspecified mood [affective] disorder: Secondary | ICD-10-CM

## 2019-06-03 ENCOUNTER — Other Ambulatory Visit: Payer: Self-pay | Admitting: Internal Medicine

## 2019-06-16 ENCOUNTER — Ambulatory Visit: Payer: No Typology Code available for payment source

## 2019-06-17 ENCOUNTER — Ambulatory Visit: Payer: No Typology Code available for payment source

## 2019-07-01 ENCOUNTER — Other Ambulatory Visit: Payer: Self-pay

## 2019-07-01 ENCOUNTER — Ambulatory Visit: Payer: No Typology Code available for payment source

## 2019-07-01 ENCOUNTER — Ambulatory Visit (INDEPENDENT_AMBULATORY_CARE_PROVIDER_SITE_OTHER): Payer: 59

## 2019-07-01 DIAGNOSIS — E538 Deficiency of other specified B group vitamins: Secondary | ICD-10-CM | POA: Diagnosis not present

## 2019-07-01 MED ORDER — CYANOCOBALAMIN 1000 MCG/ML IJ SOLN
1000.0000 ug | Freq: Once | INTRAMUSCULAR | Status: AC
  Administered 2019-07-01: 1000 ug via INTRAMUSCULAR

## 2019-07-01 NOTE — Progress Notes (Signed)
Per orders of Webb Silversmith, NP, injection of 1st of 4 monthly B12 injections given by Pilar Grammes, CMA. Patient tolerated injection well.

## 2019-07-21 ENCOUNTER — Ambulatory Visit: Payer: No Typology Code available for payment source

## 2019-08-05 ENCOUNTER — Ambulatory Visit (INDEPENDENT_AMBULATORY_CARE_PROVIDER_SITE_OTHER): Payer: 59

## 2019-08-05 ENCOUNTER — Other Ambulatory Visit: Payer: Self-pay

## 2019-08-05 DIAGNOSIS — E538 Deficiency of other specified B group vitamins: Secondary | ICD-10-CM | POA: Diagnosis not present

## 2019-08-05 MED ORDER — CYANOCOBALAMIN 1000 MCG/ML IJ SOLN
1000.0000 ug | Freq: Once | INTRAMUSCULAR | Status: AC
  Administered 2019-08-05: 09:00:00 1000 ug via INTRAMUSCULAR

## 2019-08-05 NOTE — Progress Notes (Signed)
Baity out of the office   Per orders of Webb Silversmith, NP, injection of 2nd of 4 monthly B12 injections given by Esmeralda Arthur.  Patient tolerated injection well.

## 2019-08-18 ENCOUNTER — Other Ambulatory Visit: Payer: Self-pay | Admitting: Internal Medicine

## 2019-08-18 DIAGNOSIS — G43919 Migraine, unspecified, intractable, without status migrainosus: Secondary | ICD-10-CM

## 2019-08-18 NOTE — Telephone Encounter (Signed)
Last filled 05/2018 with 11 refills... please advise

## 2019-09-09 ENCOUNTER — Ambulatory Visit: Payer: 59

## 2019-09-15 ENCOUNTER — Ambulatory Visit: Payer: 59

## 2019-09-16 ENCOUNTER — Ambulatory Visit: Payer: 59

## 2020-03-22 DIAGNOSIS — F410 Panic disorder [episodic paroxysmal anxiety] without agoraphobia: Secondary | ICD-10-CM | POA: Insufficient documentation

## 2020-03-22 DIAGNOSIS — I10 Essential (primary) hypertension: Secondary | ICD-10-CM | POA: Insufficient documentation

## 2020-03-22 DIAGNOSIS — G43719 Chronic migraine without aura, intractable, without status migrainosus: Secondary | ICD-10-CM | POA: Insufficient documentation

## 2020-04-24 ENCOUNTER — Other Ambulatory Visit: Payer: Self-pay | Admitting: Internal Medicine

## 2020-05-03 DIAGNOSIS — M545 Low back pain, unspecified: Secondary | ICD-10-CM | POA: Insufficient documentation

## 2020-05-03 DIAGNOSIS — E782 Mixed hyperlipidemia: Secondary | ICD-10-CM | POA: Insufficient documentation

## 2020-06-17 ENCOUNTER — Other Ambulatory Visit: Payer: Self-pay | Admitting: Internal Medicine

## 2020-06-23 DIAGNOSIS — M5414 Radiculopathy, thoracic region: Secondary | ICD-10-CM | POA: Insufficient documentation

## 2020-07-21 LAB — HM MAMMOGRAPHY

## 2020-07-27 IMAGING — MG MM BREAST LOCALIZATION CLIP
3 series · 3 of 3 positions shown · non-contrast
Comparison: Previous exam(s).

CLINICAL DATA: Status post ultrasound-guided core biopsies of a
mass in the 9 o'clock region the right breast, a right axillary
lymph node and a mass in the 8 o'clock region of the left breast.

EXAM:
DIAGNOSTIC BILATERAL MAMMOGRAM POST ULTRASOUND BIOPSIES

[R ML]
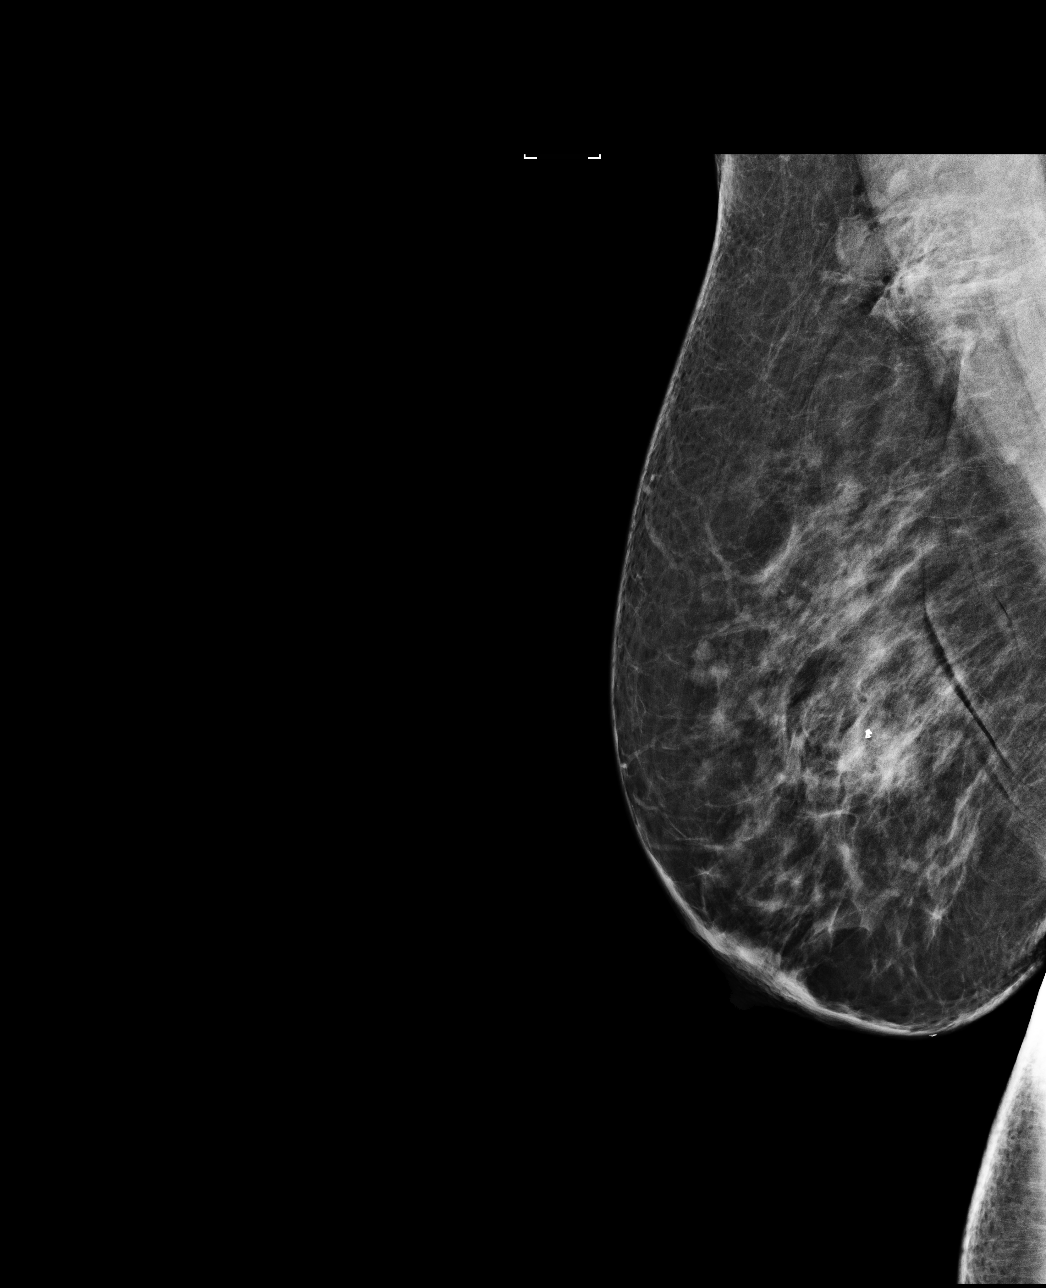

[R MLO]
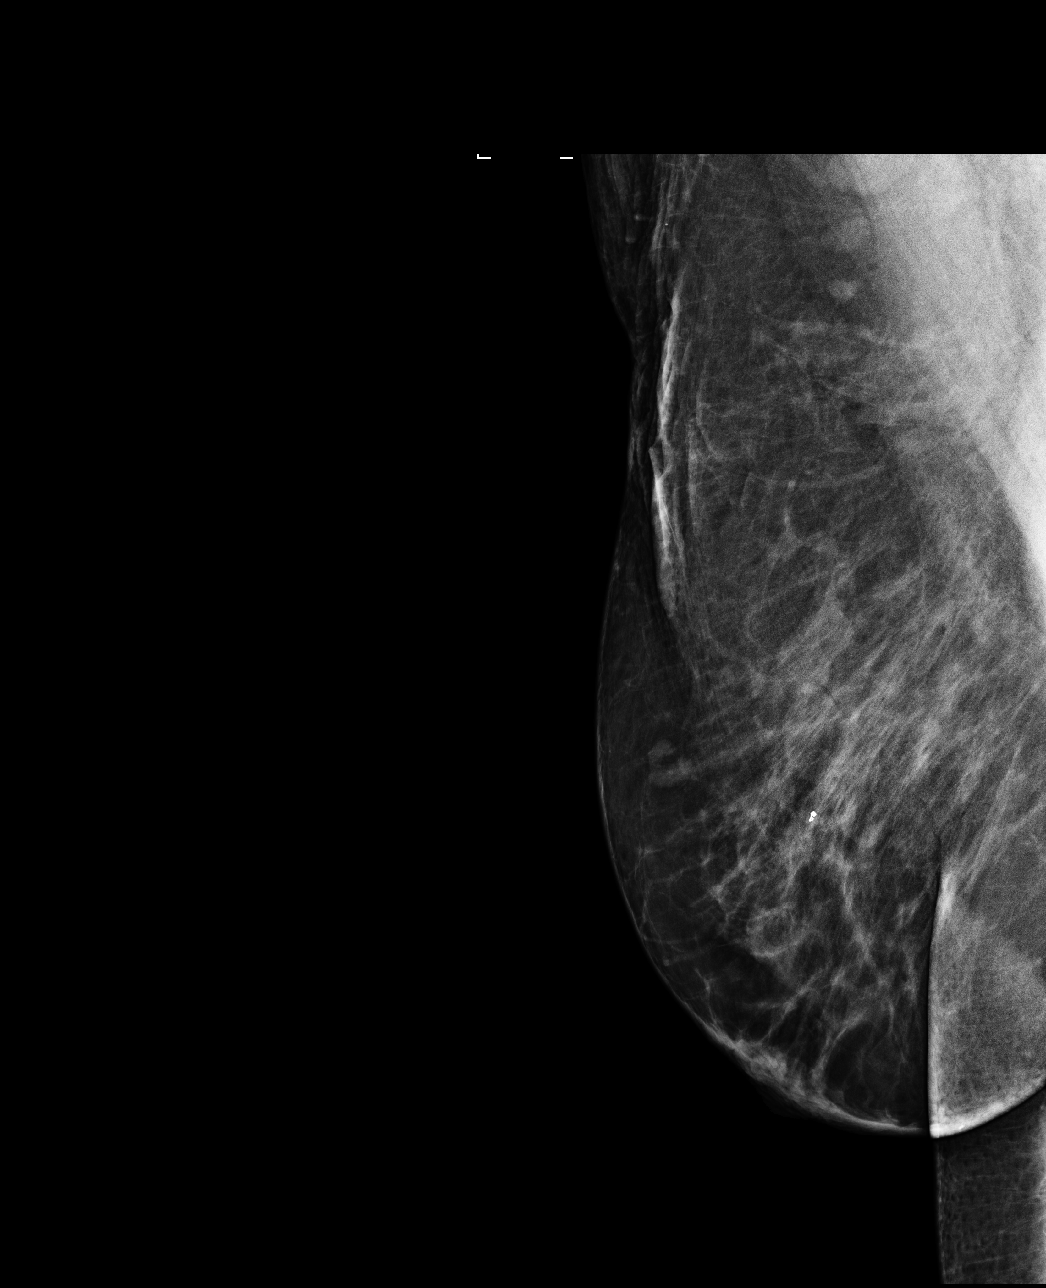

[R CC]
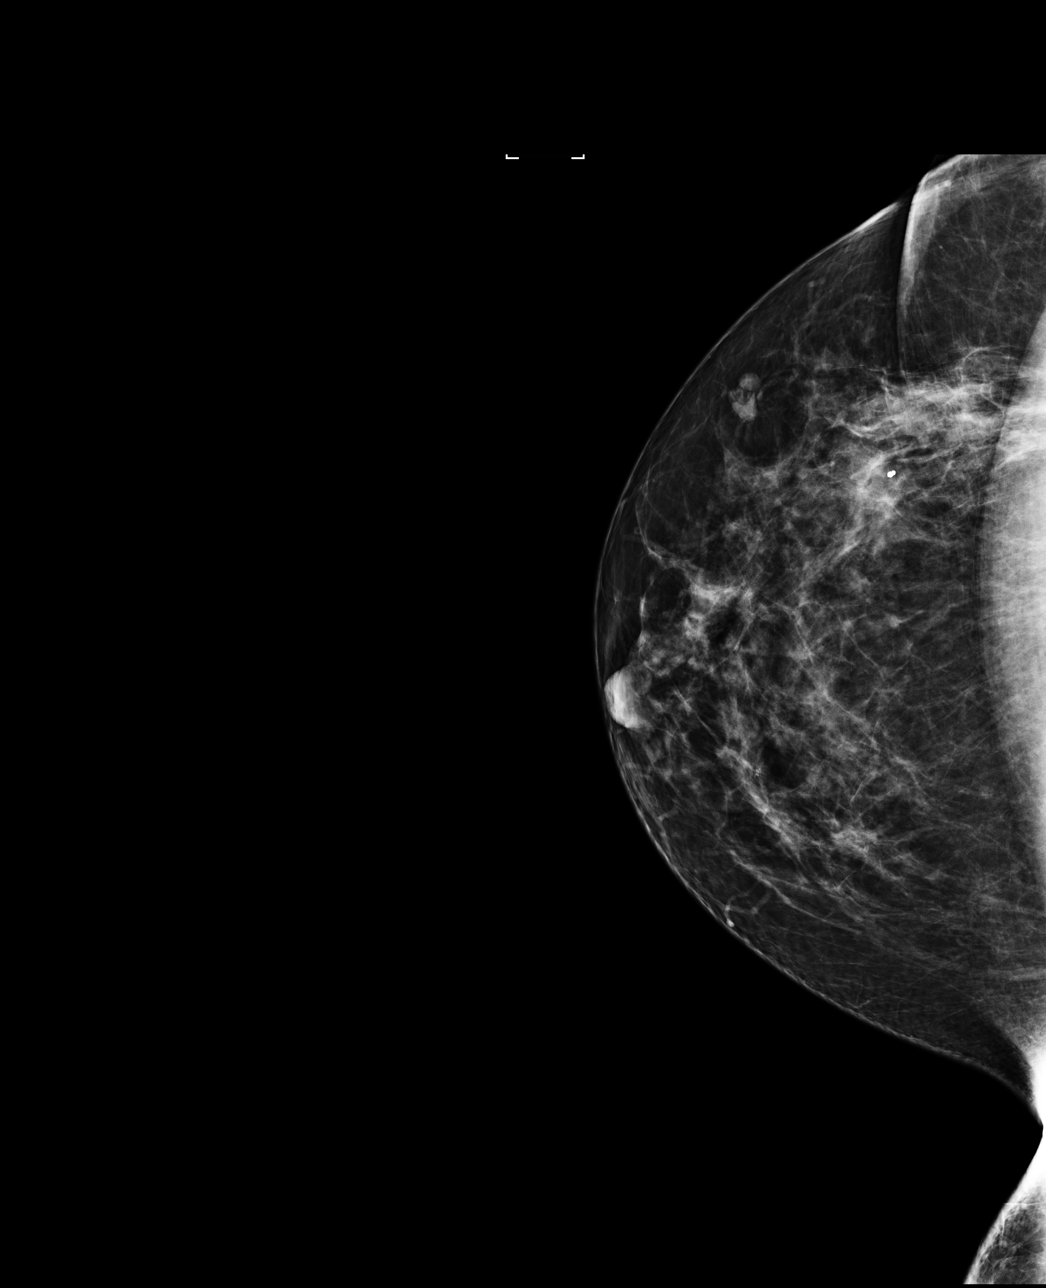

[3 of 3 positions shown; findings below may reference images not displayed]

FINDINGS: Mammographic images were obtained following ultrasound guided
biopsies of a mass in the 9 o'clock region of the right breast,
right axillary lymph node and a mass in the 8 o'clock region of the
left breast. Mammographic images show there is a venous shaped clip
in the 9 o'clock region of the right breast in appropriate position.
There is a coil shaped clip in the lower outer quadrant of the left
breast in appropriate position. The HydroMARK clip in the right
axilla was not seen mammographically likely secondary to its
posterior location.
IMPRESSION: Status post ultrasound-guided core biopsies of a mass in the 9
o'clock region of the right breast, right axillary lymph node and a
mass in the 8 o'clock region of the left breast with pathology
pending.

Final Assessment: Post Procedure Mammograms for Marker Placement

## 2020-10-24 ENCOUNTER — Encounter: Payer: Self-pay | Admitting: Internal Medicine

## 2020-10-24 ENCOUNTER — Ambulatory Visit (INDEPENDENT_AMBULATORY_CARE_PROVIDER_SITE_OTHER): Payer: No Typology Code available for payment source | Admitting: Internal Medicine

## 2020-10-24 ENCOUNTER — Other Ambulatory Visit: Payer: Self-pay

## 2020-10-24 VITALS — BP 128/78 | HR 83 | Temp 97.7°F | Resp 17 | Ht 60.0 in | Wt 189.0 lb

## 2020-10-24 DIAGNOSIS — F5104 Psychophysiologic insomnia: Secondary | ICD-10-CM

## 2020-10-24 DIAGNOSIS — Z1159 Encounter for screening for other viral diseases: Secondary | ICD-10-CM

## 2020-10-24 DIAGNOSIS — R7303 Prediabetes: Secondary | ICD-10-CM

## 2020-10-24 DIAGNOSIS — G43C1 Periodic headache syndromes in child or adult, intractable: Secondary | ICD-10-CM

## 2020-10-24 DIAGNOSIS — I10 Essential (primary) hypertension: Secondary | ICD-10-CM

## 2020-10-24 DIAGNOSIS — Z0001 Encounter for general adult medical examination with abnormal findings: Secondary | ICD-10-CM

## 2020-10-24 DIAGNOSIS — Z6836 Body mass index (BMI) 36.0-36.9, adult: Secondary | ICD-10-CM

## 2020-10-24 DIAGNOSIS — Z114 Encounter for screening for human immunodeficiency virus [HIV]: Secondary | ICD-10-CM | POA: Diagnosis not present

## 2020-10-24 DIAGNOSIS — F419 Anxiety disorder, unspecified: Secondary | ICD-10-CM

## 2020-10-24 DIAGNOSIS — M545 Low back pain, unspecified: Secondary | ICD-10-CM | POA: Diagnosis not present

## 2020-10-24 DIAGNOSIS — Z853 Personal history of malignant neoplasm of breast: Secondary | ICD-10-CM

## 2020-10-24 DIAGNOSIS — F32A Depression, unspecified: Secondary | ICD-10-CM

## 2020-10-24 DIAGNOSIS — G8929 Other chronic pain: Secondary | ICD-10-CM

## 2020-10-24 DIAGNOSIS — E66812 Obesity, class 2: Secondary | ICD-10-CM

## 2020-10-24 MED ORDER — TOPIRAMATE 25 MG PO TABS: 100.0000 mg | ORAL_TABLET | Freq: Every day | ORAL | 0 refills | Status: DC

## 2020-10-24 NOTE — Assessment & Plan Note (Signed)
Encouraged diet and exercise for weight loss ?

## 2020-10-24 NOTE — Assessment & Plan Note (Signed)
She will continue yearly mammograms Continue Letrozole She will continue to see oncology, will follow

## 2020-10-24 NOTE — Patient Instructions (Signed)

## 2020-10-24 NOTE — Assessment & Plan Note (Signed)
Continue Ambien as needed 

## 2020-10-24 NOTE — Progress Notes (Signed)
Subjective:    Patient ID: Mallory Meyers, female    DOB: 04/03/1971, 50 y.o.   MRN: [id number]  HPI  Patient presents to clinic today for her annual exam.  She is also due to follow-up chronic conditions.  History of Right Breast Cancer: Status post lumpectomy, chemo and radiation.  She is taking Letrozole as prescribed.  She follows with oncology.  Migraines: These occur 1-2 times per month.  Managed on Topamax, Fioricet and Zomig.  She does not follow with neurology.  Anxiety and Depression: Chronic, managed on Sertraline, Wellbutrin, Hydroxyzine and Xanax.  She is not currently seeing a therapist.  She denies SI/HI.  HTN: Her BP today is 128/78.  She is taking Metoprolol and Triamterene HCT as prescribed.  ECG from 11/2014 reviewed.  Insomnia: She denies any issues sleeping at this time.  She takes Ambien on a very rare basis. There is no sleep study on file.  Chronic Low Back Pain: Managed on Gabapentin, Flexeril and Tramadol.  She has been going to physical therapy and following with pain management for injections. She would like referral to pain management in this area.  Prediabetes: Her last A1c was 5.5%, 04/2020.  She is not taking any oral diabetic medication at this time.  She does not check her sugars.  Flu: 12/2019 Tetanus: 08/2014 COVID: Pfizer x3 Pap smear: 04/2020 Mammogram: 04/2018 Colon screening: 08/2016, 5 years Vision screening: annually Dentist: 3 x year  Diet: She does eat meat. She consumes some fruits and veggies. She does eat fried foods. She drinks some water. Exercise: None  Review of Systems     Past Medical History:  Diagnosis Date   Anemia    Breast cancer ([place]) 04/2018   RIGHT   Depression    Headache    Hypertension    Polycystic ovaries    Seizures ([facility])    AS A CHILD    Current Outpatient Medications  Medication Sig Dispense Refill   buPROPion (WELLBUTRIN XL) 150 MG 24 hr tablet Take 1 tablet (150 mg total) by mouth daily. 90 tablet 2    buPROPion (WELLBUTRIN XL) 300 MG 24 hr tablet TAKE 1 TABLET BY MOUTH EVERY DAY 90 tablet 2   cyclobenzaprine (FLEXERIL) 10 MG tablet Take 1 tablet (10 mg total) by mouth daily as needed for muscle spasms. 30 tablet 0   gabapentin (NEURONTIN) 300 MG capsule Take 1 capsule (300 mg total) by mouth 3 (three) times daily. 90 capsule 11   hydrOXYzine (ATARAX/VISTARIL) 10 MG tablet TAKE 1 TABLET BY MOUTH EVERY DAY AS NEEDED 90 tablet 1   metoprolol succinate (TOPROL-XL) 100 MG 24 hr tablet Take 1 tablet (100 mg total) by mouth daily. MUST SCHEDULE ANNUAL EXAM FOR REFILLS 90 tablet 3   Multiple Vitamins-Minerals (ONE-A-DAY WOMENS 50 PLUS PO) Take 1 tablet by mouth daily at 12 noon.     sertraline (ZOLOFT) 100 MG tablet Take 1 tablet (100 mg total) by mouth daily. 90 tablet 3   topiramate (TOPAMAX) 25 MG tablet TAKE 4 TABLETS (100 MG TOTAL) BY MOUTH DAILY. 360 tablet 0   traMADol (ULTRAM) 50 MG tablet Take 2 tablets (100 mg total) by mouth daily as needed. 60 tablet 0   triamterene-hydrochlorothiazide (MAXZIDE-25) 37.5-25 MG tablet Take 1 tablet by mouth daily. 90 tablet 0   Vitamin D, Ergocalciferol, (DRISDOL) 1.25 MG (50000 UT) CAPS capsule Take 1 capsule (50,000 Units total) by mouth every 7 (seven) days. 12 capsule 0   zolmitriptan (ZOMIG) 5  MG tablet TAKE 1 TABLET (5 MG TOTAL) BY MOUTH ONCE AS NEEDED. MAY REPEAT IN TWO HOURS AS NEEDED 6 tablet 11   zolpidem (AMBIEN) 5 MG tablet Take 1 tablet (5 mg total) by mouth at bedtime as needed for sleep. 30 tablet 0   No current facility-administered medications for this visit.    No Known Allergies  Family History  Problem Relation Age of Onset   Lung disease Mother    Hypertension Mother    Kidney disease Mother    Diabetes Mother    Colon polyps Mother        colon resection   Hypertension Maternal Grandmother    Breast cancer Neg Hx    Colon cancer Neg Hx     Social History   Socioeconomic History   Marital status: Legally Separated     Spouse name: Not on file   Number of children: Not on file   Years of education: Not on file   Highest education level: Not on file  Occupational History   Not on file  Tobacco Use   Smoking status: Never   Smokeless tobacco: Never  Vaping Use   Vaping Use: Never used  Substance and Sexual Activity   Alcohol use: Yes    Alcohol/week: 0.0 standard drinks    Comment: occasional   Drug use: No   Sexual activity: Yes    Birth control/protection: None  Other Topics Concern   Not on file  Social History Narrative   Not on file   Social Determinants of Health   Financial Resource Strain: Not on file  Food Insecurity: Not on file  Transportation Needs: Not on file  Physical Activity: Not on file  Stress: Not on file  Social Connections: Not on file  Intimate Partner Violence: Not on file     Constitutional: Patient reports headaches.  Denies fever, malaise, fatigue, or abrupt weight changes.  HEENT: Pt reports bloody drainage from right ear canal. Denies eye pain, eye redness, ear pain, ringing in the ears, wax buildup, runny nose, nasal congestion, bloody nose, or sore throat. Respiratory: Denies difficulty breathing, shortness of breath, cough or sputum production.   Cardiovascular: Denies chest pain, chest tightness, palpitations or swelling in the hands or feet.  Gastrointestinal: Denies abdominal pain, bloating, constipation, diarrhea or blood in the stool.  GU: Denies urgency, frequency, pain with urination, burning sensation, blood in urine, odor or discharge. Musculoskeletal: Patient reports chronic low back pain.  Denies decrease in range of motion, difficulty with gait, or joint swelling.  Skin: Denies redness, rashes, lesions or ulcercations.  Neurological: Denies dizziness, difficulty with memory, difficulty with speech or problems with balance and coordination.  Psych: Patient has a history of anxiety and depression.  Denies SI/HI.  No other specific complaints in a  complete review of systems (except as listed in HPI above).  Objective:   Physical Exam  BP 128/78 (BP Location: Right Arm, Patient Position: Sitting, Cuff Size: Large)   Pulse 83   Temp 97.7 F (36.5 C) (Temporal)   Resp 17   Ht 5' (1.524 m)   Wt 189 lb (85.7 kg)   BMI 36.91 kg/m   Wt Readings from Last 3 Encounters:  04/14/19 192 lb (87.1 kg)  03/02/19 184 lb (83.5 kg)  06/11/18 206 lb (93.4 kg)    General: Appears her stated age, obese, in NAD. Skin: Warm, dry and intact. Abrasion noted to left lower leg HEENT: Head: normal shape and size; Eyes: sclera  white and EOMs intact; Left Ear: Tm's gray and intact, normal light reflex, dried blood noted in external ear canal;  Neck:  Neck supple, trachea midline. No masses, lumps or thyromegaly present.  Cardiovascular: Normal rate and rhythm. S1,S2 noted.  No murmur, rubs or gallops noted. No JVD or BLE edema. No carotid bruits noted. Pulmonary/Chest: Normal effort and positive vesicular breath sounds. No respiratory distress. No wheezes, rales or ronchi noted.  Abdomen: Soft and nontender. Normal bowel sounds. No distention or masses noted. Liver, spleen and kidneys non palpable. Musculoskeletal: Bony tenderness noted over the lumbar spine. Strength 5/5 BUE/BLE. No difficulty with gait.  Neurological: Alert and oriented. Cranial nerves II-XII grossly intact. Coordination normal.  Psychiatric: Mood and affect normal. Behavior is normal. Judgment and thought content normal.    BMET    Component Value Date/Time   NA 141 04/14/2019 1517   NA 136 06/16/2015 1442   NA 137 08/15/2012 0949   K 4.0 04/14/2019 1517   K 3.8 08/15/2012 0949   CL 106 04/14/2019 1517   CL 104 08/15/2012 0949   CO2 31 04/14/2019 1517   CO2 26 08/15/2012 0949   GLUCOSE 81 04/14/2019 1517   GLUCOSE 92 08/15/2012 0949   BUN 16 04/14/2019 1517   BUN 15 06/16/2015 1442   BUN 16 08/15/2012 0949   CREATININE 1.12 04/14/2019 1517   CREATININE 0.89 08/15/2012  0949   CALCIUM 9.2 04/14/2019 1517   CALCIUM 9.0 08/15/2012 0949   GFRNONAA 74 06/16/2015 1442   GFRNONAA >60 08/15/2012 0949   GFRAA 85 06/16/2015 1442   GFRAA >60 08/15/2012 0949    Lipid Panel     Component Value Date/Time   CHOL 183 04/14/2019 1517   TRIG 134.0 04/14/2019 1517   HDL 41.10 04/14/2019 1517   CHOLHDL 4 04/14/2019 1517   VLDL 26.8 04/14/2019 1517   LDLCALC 115 (H) 04/14/2019 1517    CBC    Component Value Date/Time   WBC 5.3 04/14/2019 1517   RBC 4.94 04/14/2019 1517   HGB 13.1 04/14/2019 1517   HGB 13.6 06/16/2015 1442   HCT 40.6 04/14/2019 1517   HCT 40.4 06/16/2015 1442   PLT 253.0 04/14/2019 1517   PLT 302 06/16/2015 1442   MCV 82.2 04/14/2019 1517   MCV 82 06/16/2015 1442   MCV 82 08/15/2012 0949   MCH 27.5 06/16/2015 1442   MCH 27.4 12/22/2014 1052   MCHC 32.4 04/14/2019 1517   RDW 13.3 04/14/2019 1517   RDW 13.0 06/16/2015 1442   RDW 12.7 08/15/2012 0949   LYMPHSABS 2.0 12/22/2014 1052   MONOABS 0.4 12/22/2014 1052   EOSABS 0.2 12/22/2014 1052   BASOSABS 0.1 12/22/2014 1052    Hgb A1C Lab Results  Component Value Date   HGBA1C 5.8 07/09/2016            Assessment & Plan:   Preventative Health Maintenance:  Encouraged her to get a flu shot in the fall Tetanus UTD Encouraged her to get her COVID booster Pap smear UTD Mammogram Colon screening due 2023 Encouraged her to consume a balanced diet and exercise regimen Advised her to see an eye doctor and dentist annually We will check CBC, c-Met, TSH, lipid, A1c, HIV and hep C today  RTC in 1 year, sooner if needed Webb Silversmith, NP This visit occurred during the SARS-CoV-2 public health emergency.  Safety protocols were in place, including screening questions prior to the visit, additional usage of staff PPE, and extensive cleaning of  exam room while observing appropriate contact time as indicated for disinfecting solutions.

## 2020-10-24 NOTE — Assessment & Plan Note (Signed)
Managed on Topamax, Fioricet and Zomig Will monitor

## 2020-10-24 NOTE — Assessment & Plan Note (Signed)
A1C today Encouraged her to consume a low carb diet and exercise for weight loss

## 2020-10-24 NOTE — Assessment & Plan Note (Signed)
Controlled on Metoprolol and Triamterene HCT Reinforced DASH diet and exercise for weight loss CMET today

## 2020-10-24 NOTE — Assessment & Plan Note (Signed)
Ongoing pain issues, unrelieved by injections Continue Gabapentin, Flexeril and Tramadol Referral to pain management in this area

## 2020-10-24 NOTE — Assessment & Plan Note (Signed)
Continue Sertraline, Wellbutrin, Hydroxyzine and Xanax Support offered

## 2020-10-25 LAB — LIPID PANEL
Cholesterol: 214 mg/dL — ABNORMAL HIGH (ref <200)
HDL: 50 mg/dL (ref >=50)
LDL Cholesterol (Calc): 137 mg/dL (calc) — ABNORMAL HIGH
Non-HDL Cholesterol (Calc): 164 mg/dL (calc) — ABNORMAL HIGH (ref <130)
Total CHOL/HDL Ratio: 4.3 (calc) (ref <5.0)
Triglycerides: 142 mg/dL (ref <150)

## 2020-10-25 LAB — HEMOGLOBIN A1C
Hgb A1c MFr Bld: 5.6 % of total Hgb (ref <5.7)
Mean Plasma Glucose: 114 mg/dL
eAG (mmol/L): 6.3 mmol/L

## 2020-10-25 LAB — COMPLETE METABOLIC PANEL WITH GFR
AG Ratio: 1.6 (calc) (ref 1.0–2.5)
ALT: 17 U/L (ref 6–29)
AST: 18 U/L (ref 10–35)
Albumin: 4.5 g/dL (ref 3.6–5.1)
Alkaline phosphatase (APISO): 77 U/L (ref 37–153)
BUN: 21 mg/dL (ref 7–25)
CO2: 30 mmol/L (ref 20–32)
Calcium: 10 mg/dL (ref 8.6–10.4)
Chloride: 102 mmol/L (ref 98–110)
Creat: 1.03 mg/dL (ref 0.50–1.05)
GFR, Est African American: 73 mL/min/{1.73_m2} (ref >=60)
GFR, Est Non African American: 63 mL/min/{1.73_m2} (ref >=60)
Globulin: 2.9 g/dL (calc) (ref 1.9–3.7)
Glucose, Bld: 95 mg/dL (ref 65–99)
Potassium: 4 mmol/L (ref 3.5–5.3)
Sodium: 138 mmol/L (ref 135–146)
Total Bilirubin: 0.5 mg/dL (ref 0.2–1.2)
Total Protein: 7.4 g/dL (ref 6.1–8.1)

## 2020-10-25 LAB — CBC
HCT: 43.3 % (ref 35.0–45.0)
Hemoglobin: 13.7 g/dL (ref 11.7–15.5)
MCH: 26.4 pg — ABNORMAL LOW (ref 27.0–33.0)
MCHC: 31.6 g/dL — ABNORMAL LOW (ref 32.0–36.0)
MCV: 83.6 fL (ref 80.0–100.0)
MPV: 9.9 fL (ref 7.5–12.5)
Platelets: 282 10*3/uL (ref 140–400)
RBC: 5.18 10*6/uL — ABNORMAL HIGH (ref 3.80–5.10)
RDW: 13.4 % (ref 11.0–15.0)
WBC: 5.3 10*3/uL (ref 3.8–10.8)

## 2020-10-25 LAB — HIV ANTIBODY (ROUTINE TESTING W REFLEX): HIV 1&2 Ab, 4th Generation: NONREACTIVE

## 2020-10-25 LAB — HEPATITIS C ANTIBODY
Hepatitis C Ab: NONREACTIVE
SIGNAL TO CUT-OFF: 0.03 (ref <1.00)

## 2020-10-25 LAB — TSH: TSH: 1.62 mIU/L

## 2021-01-18 ENCOUNTER — Other Ambulatory Visit: Payer: Self-pay | Admitting: Internal Medicine

## 2021-01-18 DIAGNOSIS — G43C1 Periodic headache syndromes in child or adult, intractable: Secondary | ICD-10-CM

## 2021-01-18 NOTE — Telephone Encounter (Signed)
Requested medications are due for refill today.  yes  Requested medications are on the active medications list.  yes  Last refill. 10/24/2020 #360 no refills  Future visit scheduled.   no  Notes to clinic.  Medication not delegated.

## 2021-01-22 ENCOUNTER — Telehealth: Payer: Self-pay | Admitting: Internal Medicine

## 2021-01-22 NOTE — Telephone Encounter (Signed)
Pt called to change her number, as the home phon is not connected. Pt has referral to see Dr. Dossie Arbour.  She would like to get in to see him asap.   Pt new number has been updated.  [phone]

## 2021-02-05 ENCOUNTER — Other Ambulatory Visit: Payer: Self-pay | Admitting: Internal Medicine

## 2021-02-05 NOTE — Telephone Encounter (Signed)
Medication Refill - Medication: Tramadol  Has the patient contacted their pharmacy? Yes.   Pt states that the pharmacy sent over a fax with refill request. Please advise.  (Agent: If no, request that the patient contact the pharmacy for the refill.) (Agent: If yes, when and what did the pharmacy advise?)  Preferred Pharmacy (with phone number or street name):  [id number] - [place], [address] [place]  [place] [address]  Phone: [phone] Fax: [phone]  Hours: Not open 24 hours   Has the patient been seen for an appointment in the last year OR does the patient have an upcoming appointment? Yes.    Agent: Please be advised that RX refills may take up to 3 business days. We ask that you follow-up with your pharmacy.

## 2021-02-06 MED ORDER — TRAMADOL HCL 50 MG PO TABS: 100.0000 mg | ORAL_TABLET | Freq: Every day | ORAL | 0 refills | Status: AC | PRN

## 2021-02-06 NOTE — Telephone Encounter (Signed)
Requested medications are on the active medication list yes  Last visit 09/2020  Future visit scheduled no  Notes to clinic This med is not delegated and there has not been a urine drug test within 360 days, please assess.

## 2021-02-07 ENCOUNTER — Other Ambulatory Visit: Payer: Self-pay

## 2021-02-09 ENCOUNTER — Other Ambulatory Visit: Payer: Self-pay

## 2021-03-12 ENCOUNTER — Encounter: Payer: Self-pay | Admitting: Internal Medicine

## 2021-03-12 ENCOUNTER — Other Ambulatory Visit: Payer: Self-pay

## 2021-03-12 ENCOUNTER — Ambulatory Visit (INDEPENDENT_AMBULATORY_CARE_PROVIDER_SITE_OTHER): Payer: No Typology Code available for payment source | Admitting: Internal Medicine

## 2021-03-12 VITALS — BP 126/71 | HR 83 | Temp 97.7°F | Resp 18 | Ht 60.0 in | Wt 190.6 lb

## 2021-03-12 DIAGNOSIS — M25512 Pain in left shoulder: Secondary | ICD-10-CM

## 2021-03-12 MED ORDER — PREDNISONE 10 MG PO TABS: ORAL_TABLET | ORAL | 0 refills | Status: DC

## 2021-03-12 MED ORDER — CYCLOBENZAPRINE HCL 10 MG PO TABS: 10.0000 mg | ORAL_TABLET | Freq: Every day | ORAL | 0 refills | Status: AC | PRN

## 2021-03-12 NOTE — Progress Notes (Signed)
Subjective:    Patient ID: Mallory Meyers, female    DOB: 05/27/1970, 50 y.o.   MRN: [id number]  HPI  Pt presents to the clinic today with c/o left shoulder pain. She noticed this 3 days ago. She reports the pain can be achy or sharp. The pain is worse with movement. She does have some radiation into her left upper chest but denies arm pain, numbness, tingling or weakness. She denies any injury to the area. She has not noticed any changes in the left breast. She has a history of breast cancer on the right. Mammogram from 06/2020 reviewed. She has tried Tylenol OTC with minimal relief of symptoms.   Review of Systems  Past Medical History:  Diagnosis Date   Anemia    Breast cancer ([place]) 04/2018   RIGHT   Depression    Headache    Hypertension    Polycystic ovaries    Seizures ([facility])    AS A CHILD    Current Outpatient Medications  Medication Sig Dispense Refill   buPROPion (WELLBUTRIN XL) 300 MG 24 hr tablet TAKE 1 TABLET BY MOUTH EVERY DAY 90 tablet 2   cyclobenzaprine (FLEXERIL) 10 MG tablet Take 1 tablet (10 mg total) by mouth daily as needed for muscle spasms. 30 tablet 0   gabapentin (NEURONTIN) 300 MG capsule Take 1 capsule (300 mg total) by mouth 3 (three) times daily. 90 capsule 11   hydrOXYzine (ATARAX/VISTARIL) 10 MG tablet TAKE 1 TABLET BY MOUTH EVERY DAY AS NEEDED 90 tablet 1   letrozole (FEMARA) 2.5 MG tablet Take 2.5 mg by mouth daily.     metoprolol succinate (TOPROL-XL) 100 MG 24 hr tablet Take 1 tablet (100 mg total) by mouth daily. MUST SCHEDULE ANNUAL EXAM FOR REFILLS 90 tablet 3   Multiple Vitamins-Minerals (ONE-A-DAY WOMENS 50 PLUS PO) Take 1 tablet by mouth daily at 12 noon.     sertraline (ZOLOFT) 100 MG tablet Take 1 tablet (100 mg total) by mouth daily. 90 tablet 3   topiramate (TOPAMAX) 25 MG tablet TAKE 4 TABLETS BY MOUTH DAILY. 360 tablet 0   traMADol (ULTRAM) 50 MG tablet Take 2 tablets (100 mg total) by mouth daily as needed. 60 tablet 0    triamterene-hydrochlorothiazide (MAXZIDE-25) 37.5-25 MG tablet Take 1 tablet by mouth daily. 90 tablet 0   zolmitriptan (ZOMIG) 5 MG tablet TAKE 1 TABLET (5 MG TOTAL) BY MOUTH ONCE AS NEEDED. MAY REPEAT IN TWO HOURS AS NEEDED 6 tablet 11   zolpidem (AMBIEN) 5 MG tablet Take 1 tablet (5 mg total) by mouth at bedtime as needed for sleep. 30 tablet 0   No current facility-administered medications for this visit.    No Known Allergies  Family History  Problem Relation Age of Onset   Lung disease Mother    Hypertension Mother    Kidney disease Mother    Diabetes Mother    Colon polyps Mother        colon resection   Hypertension Maternal Grandmother    Breast cancer Neg Hx    Colon cancer Neg Hx     Social History   Socioeconomic History   Marital status: Legally Separated    Spouse name: Not on file   Number of children: Not on file   Years of education: Not on file   Highest education level: Not on file  Occupational History   Not on file  Tobacco Use   Smoking status: Never   Smokeless tobacco: Never  Vaping Use   Vaping Use: Never used  Substance and Sexual Activity   Alcohol use: Yes    Alcohol/week: 0.0 standard drinks    Comment: occasional   Drug use: No   Sexual activity: Yes    Birth control/protection: None  Other Topics Concern   Not on file  Social History Narrative   Not on file   Social Determinants of Health   Financial Resource Strain: Not on file  Food Insecurity: Not on file  Transportation Needs: Not on file  Physical Activity: Not on file  Stress: Not on file  Social Connections: Not on file  Intimate Partner Violence: Not on file     Constitutional: Denies fever, malaise, fatigue, headache or abrupt weight changes.  Respiratory: Denies difficulty breathing, shortness of breath, cough or sputum production.   Cardiovascular: Denies chest pain, chest tightness, palpitations or swelling in the hands or feet.  Gastrointestinal: Denies  abdominal pain, bloating, constipation, diarrhea or blood in the stool.  Musculoskeletal: Pt reports left shoulder pain. Denies decrease in range of motion, difficulty with gait, muscle pain or joint swelling.  Skin: Denies redness, rashes, lesions or ulcercations.  Neurological: Denies numbness, tingling or weakness of the left upper extremity.    No other specific complaints in a complete review of systems (except as listed in HPI above).     Objective:   Physical Exam BP 126/71 (BP Location: Right Arm, Patient Position: Sitting, Cuff Size: Large)   Pulse 83   Temp 97.7 F (36.5 C) (Temporal)   Resp 18   Ht 5' (1.524 m)   Wt 190 lb 9.6 oz (86.5 kg)   SpO2 100%   BMI 37.22 kg/m   Wt Readings from Last 3 Encounters:  10/24/20 189 lb (85.7 kg)  04/14/19 192 lb (87.1 kg)  03/02/19 184 lb (83.5 kg)    General: Appears her stated age, obese, in NAD. Skin: Warm, dry and intact. Cardiovascular: Normal rate and rhythm.  Pulmonary/Chest: Normal effort and positive vesicular breath sounds. No respiratory distress. No wheezes, rales or ronchi noted.  Musculoskeletal: Normal external rotation of the left shoulder. Pain with internal rotation of the left shoulder. Pain with palpation over the left proximal biceps tendon. Negative drop can test. Strength 5/5 BUE/BLE. Hand grips equal. Neurological: Alert and oriented.  BMET    Component Value Date/Time   NA 138 10/24/2020 0840   NA 136 06/16/2015 1442   NA 137 08/15/2012 0949   K 4.0 10/24/2020 0840   K 3.8 08/15/2012 0949   CL 102 10/24/2020 0840   CL 104 08/15/2012 0949   CO2 30 10/24/2020 0840   CO2 26 08/15/2012 0949   GLUCOSE 95 10/24/2020 0840   GLUCOSE 92 08/15/2012 0949   BUN 21 10/24/2020 0840   BUN 15 06/16/2015 1442   BUN 16 08/15/2012 0949   CREATININE 1.03 10/24/2020 0840   CALCIUM 10.0 10/24/2020 0840   CALCIUM 9.0 08/15/2012 0949   GFRNONAA 63 10/24/2020 0840   GFRAA 73 10/24/2020 0840    Lipid Panel      Component Value Date/Time   CHOL 214 (H) 10/24/2020 0840   TRIG 142 10/24/2020 0840   HDL 50 10/24/2020 0840   CHOLHDL 4.3 10/24/2020 0840   VLDL 26.8 04/14/2019 1517   LDLCALC 137 (H) 10/24/2020 0840    CBC    Component Value Date/Time   WBC 5.3 10/24/2020 0840   RBC 5.18 (H) 10/24/2020 0840   HGB 13.7 10/24/2020 0840  HGB 13.6 06/16/2015 1442   HCT 43.3 10/24/2020 0840   HCT 40.4 06/16/2015 1442   PLT 282 10/24/2020 0840   PLT 302 06/16/2015 1442   MCV 83.6 10/24/2020 0840   MCV 82 06/16/2015 1442   MCV 82 08/15/2012 0949   MCH 26.4 (L) 10/24/2020 0840   MCHC 31.6 (L) 10/24/2020 0840   RDW 13.4 10/24/2020 0840   RDW 13.0 06/16/2015 1442   RDW 12.7 08/15/2012 0949   LYMPHSABS 2.0 12/22/2014 1052   MONOABS 0.4 12/22/2014 1052   EOSABS 0.2 12/22/2014 1052   BASOSABS 0.1 12/22/2014 1052    Hgb A1C Lab Results  Component Value Date   HGBA1C 5.6 10/24/2020            Assessment & Plan:   Acute Left Shoulder Pain:  Biceps Tendonitis vs muscle strain Avoid overuse/repetitive motions RX for Pred Taper x 6 days Flexeril refilled Encouraged ice and stretching Return precautions discussed  Webb Silversmith, NP This visit occurred during the SARS-CoV-2 public health emergency.  Safety protocols were in place, including screening questions prior to the visit, additional usage of staff PPE, and extensive cleaning of exam room while observing appropriate contact time as indicated for disinfecting solutions.

## 2021-03-12 NOTE — Patient Instructions (Signed)
Shoulder Exercises Ask your health care provider which exercises are safe for you. Do exercises exactly as told by your health care provider and adjust them as directed. It is normal to feel mild stretching, pulling, tightness, or discomfort as you do these exercises. Stop right away if you feel sudden pain or your pain gets worse. Do not begin these exercises until told by your health care provider. Stretching exercises External rotation and abduction This exercise is sometimes called corner stretch. This exercise rotates your arm outward (external rotation) and moves your arm out from your body (abduction). Stand in a doorway with one of your feet slightly in front of the other. This is called a staggered stance. If you cannot reach your forearms to the door frame, stand facing a corner of a room. Choose one of the following positions as told by your health care provider: Place your hands and forearms on the door frame above your head. Place your hands and forearms on the door frame at the height of your head. Place your hands on the door frame at the height of your elbows. Slowly move your weight onto your front foot until you feel a stretch across your chest and in the front of your shoulders. Keep your head and chest upright and keep your abdominal muscles tight. Hold for __________ seconds. To release the stretch, shift your weight to your back foot. Repeat __________ times. Complete this exercise __________ times a day. Extension, standing Stand and hold a broomstick, a cane, or a similar object behind your back. Your hands should be a little wider than shoulder width apart. Your palms should face away from your back. Keeping your elbows straight and your shoulder muscles relaxed, move the stick away from your body until you feel a stretch in your shoulders (extension). Avoid shrugging your shoulders while you move the stick. Keep your shoulder blades tucked down toward the middle of your  back. Hold for __________ seconds. Slowly return to the starting position. Repeat __________ times. Complete this exercise __________ times a day. Range-of-motion exercises Pendulum  Stand near a wall or a surface that you can hold onto for balance. Bend at the waist and let your left / right arm hang straight down. Use your other arm to support you. Keep your back straight and do not lock your knees. Relax your left / right arm and shoulder muscles, and move your hips and your trunk so your left / right arm swings freely. Your arm should swing because of the motion of your body, not because you are using your arm or shoulder muscles. Keep moving your hips and trunk so your arm swings in the following directions, as told by your health care provider: Side to side. Forward and backward. In clockwise and counterclockwise circles. Continue each motion for __________ seconds, or for as long as told by your health care provider. Slowly return to the starting position. Repeat __________ times. Complete this exercise __________ times a day. Shoulder flexion, standing  Stand and hold a broomstick, a cane, or a similar object. Place your hands a little more than shoulder width apart on the object. Your left / right hand should be palm up, and your other hand should be palm down. Keep your elbow straight and your shoulder muscles relaxed. Push the stick up with your healthy arm to raise your left / right arm in front of your body, and then over your head until you feel a stretch in your shoulder (flexion). Avoid   shrugging your shoulder while you raise your arm. Keep your shoulder blade tucked down toward the middle of your back. Hold for __________ seconds. Slowly return to the starting position. Repeat __________ times. Complete this exercise __________ times a day. Shoulder abduction, standing Stand and hold a broomstick, a cane, or a similar object. Place your hands a little more than shoulder  width apart on the object. Your left / right hand should be palm up, and your other hand should be palm down. Keep your elbow straight and your shoulder muscles relaxed. Push the object across your body toward your left / right side. Raise your left / right arm to the side of your body (abduction) until you feel a stretch in your shoulder. Do not raise your arm above shoulder height unless your health care provider tells you to do that. If directed, raise your arm over your head. Avoid shrugging your shoulder while you raise your arm. Keep your shoulder blade tucked down toward the middle of your back. Hold for __________ seconds. Slowly return to the starting position. Repeat __________ times. Complete this exercise __________ times a day. Internal rotation  Place your left / right hand behind your back, palm up. Use your other hand to dangle an exercise band, a towel, or a similar object over your shoulder. Grasp the band with your left / right hand so you are holding on to both ends. Gently pull up on the band until you feel a stretch in the front of your left / right shoulder. The movement of your arm toward the center of your body is called internal rotation. Avoid shrugging your shoulder while you raise your arm. Keep your shoulder blade tucked down toward the middle of your back. Hold for __________ seconds. Release the stretch by letting go of the band and lowering your hands. Repeat __________ times. Complete this exercise __________ times a day. Strengthening exercises External rotation  Sit in a stable chair without armrests. Secure an exercise band to a stable object at elbow height on your left / right side. Place a soft object, such as a folded towel or a small pillow, between your left / right upper arm and your body to move your elbow about 4 inches (10 cm) away from your side. Hold the end of the exercise band so it is tight and there is no slack. Keeping your elbow pressed  against the soft object, slowly move your forearm out, away from your abdomen (external rotation). Keep your body steady so only your forearm moves. Hold for __________ seconds. Slowly return to the starting position. Repeat __________ times. Complete this exercise __________ times a day. Shoulder abduction  Sit in a stable chair without armrests, or stand up. Hold a __________ weight in your left / right hand, or hold an exercise band with both hands. Start with your arms straight down and your left / right palm facing in, toward your body. Slowly lift your left / right hand out to your side (abduction). Do not lift your hand above shoulder height unless your health care provider tells you that this is safe. Keep your arms straight. Avoid shrugging your shoulder while you do this movement. Keep your shoulder blade tucked down toward the middle of your back. Hold for __________ seconds. Slowly lower your arm, and return to the starting position. Repeat __________ times. Complete this exercise __________ times a day. Shoulder extension Sit in a stable chair without armrests, or stand up. Secure an exercise band   to a stable object in front of you so it is at shoulder height. Hold one end of the exercise band in each hand. Your palms should face each other. Straighten your elbows and lift your hands up to shoulder height. Step back, away from the secured end of the exercise band, until the band is tight and there is no slack. Squeeze your shoulder blades together as you pull your hands down to the sides of your thighs (extension). Stop when your hands are straight down by your sides. Do not let your hands go behind your body. Hold for __________ seconds. Slowly return to the starting position. Repeat __________ times. Complete this exercise __________ times a day. Shoulder row Sit in a stable chair without armrests, or stand up. Secure an exercise band to a stable object in front of you so it  is at waist height. Hold one end of the exercise band in each hand. Position your palms so that your thumbs are facing the ceiling (neutral position). Bend each of your elbows to a 90-degree angle (right angle) and keep your upper arms at your sides. Step back until the band is tight and there is no slack. Slowly pull your elbows back behind you. Hold for __________ seconds. Slowly return to the starting position. Repeat __________ times. Complete this exercise __________ times a day. Shoulder press-ups  Sit in a stable chair that has armrests. Sit upright, with your feet flat on the floor. Put your hands on the armrests so your elbows are bent and your fingers are pointing forward. Your hands should be about even with the sides of your body. Push down on the armrests and use your arms to lift yourself off the chair. Straighten your elbows and lift yourself up as much as you comfortably can. Move your shoulder blades down, and avoid letting your shoulders move up toward your ears. Keep your feet on the ground. As you get stronger, your feet should support less of your body weight as you lift yourself up. Hold for __________ seconds. Slowly lower yourself back into the chair. Repeat __________ times. Complete this exercise __________ times a day. Wall push-ups  Stand so you are facing a stable wall. Your feet should be about one arm-length away from the wall. Lean forward and place your palms on the wall at shoulder height. Keep your feet flat on the floor as you bend your elbows and lean forward toward the wall. Hold for __________ seconds. Straighten your elbows to push yourself back to the starting position. Repeat __________ times. Complete this exercise __________ times a day. This information is not intended to replace advice given to you by your health care provider. Make sure you discuss any questions you have with your healthcare provider. Document Revised: 08/07/2018 Document  Reviewed: 05/15/2018 [employer] Patient Education  2022 [employer]  

## 2021-04-17 DIAGNOSIS — Z789 Other specified health status: Secondary | ICD-10-CM | POA: Insufficient documentation

## 2021-04-17 DIAGNOSIS — M899 Disorder of bone, unspecified: Secondary | ICD-10-CM | POA: Insufficient documentation

## 2021-04-17 DIAGNOSIS — M160 Bilateral primary osteoarthritis of hip: Secondary | ICD-10-CM | POA: Insufficient documentation

## 2021-04-17 DIAGNOSIS — M5030 Other cervical disc degeneration, unspecified cervical region: Secondary | ICD-10-CM | POA: Insufficient documentation

## 2021-04-17 DIAGNOSIS — M5137 Other intervertebral disc degeneration, lumbosacral region: Secondary | ICD-10-CM | POA: Insufficient documentation

## 2021-04-17 DIAGNOSIS — R202 Paresthesia of skin: Secondary | ICD-10-CM | POA: Insufficient documentation

## 2021-04-17 DIAGNOSIS — M47812 Spondylosis without myelopathy or radiculopathy, cervical region: Secondary | ICD-10-CM | POA: Insufficient documentation

## 2021-04-17 DIAGNOSIS — Z79899 Other long term (current) drug therapy: Secondary | ICD-10-CM | POA: Insufficient documentation

## 2021-04-17 DIAGNOSIS — G894 Chronic pain syndrome: Secondary | ICD-10-CM | POA: Insufficient documentation

## 2021-04-17 NOTE — Progress Notes (Deleted)
Patient: Mallory Meyers  Service Category: E/M  Provider: Gaspar Cola, MD  DOB: 09-25-1970  DOS: 04/18/2021  Referring Provider: Jearld Fenton, NP  MRN: [id number]  Setting: Ambulatory outpatient  PCP: Jearld Fenton, NP  Type: New Patient  Specialty: Interventional Pain Management    Location: Office  Delivery: Face-to-face     Primary Reason(s) for Visit: Encounter for initial evaluation of one or more chronic problems (new to examiner) potentially causing chronic pain, and posing a threat to normal musculoskeletal function. (Level of risk: High) CC: No chief complaint on file.  HPI  Mallory Meyers is a 50 y.o. year old, female patient, who comes for the first time to our practice referred by Jearld Fenton, NP for our initial evaluation of her chronic pain. She has Prediabetes; Vitamin D deficiency; Intractable periodic headache syndrome; Hypertension; Anemia; Allergic rhinitis due to allergen; Chronic low back pain; Anxiety and depression; Insomnia due to other mental disorder; History of breast cancer; Class 2 severe obesity due to excess calories with serious comorbidity and body mass index (BMI) of 36.0 to 36.9 in adult [facility]); Benign essential hypertension; Infiltrating ductal carcinoma of right breast ([place]); Malignant neoplasm of upper-outer quadrant of right breast in female, estrogen receptor positive ([place]); Migraine, chronic, without aura, intractable; Mixed hyperlipidemia; Panic attack; Thoracic radiculopathy; Depression, recurrent ([place]); Back pain at L4-L5 level; Chronic pain syndrome; Pharmacologic therapy; Disorder of skeletal system; Problems influencing health status; Osteoarthritis of hips (Bilateral); DDD (degenerative disc disease), lumbosacral; DDD (degenerative disc disease), cervical; 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 Today she comes in for evaluation of her No chief complaint on file.  Pain  Assessment: Location:     Radiating:   Onset:   Duration:   Quality:   Severity:  /10 (subjective, self-reported pain score)  Effect on ADL:   Timing:   Modifying factors:   BP:     HR:    Onset and Duration: {Hx; Onset and Duration:210120511} Cause of pain: {Hx; Cause:210120521} Severity: {Pain Severity:210120502} Timing: {Symptoms; Timing:210120501} Aggravating Factors: {Causes; Aggravating pain factors:210120507} Alleviating Factors: {Causes; Alleviating Factors:210120500} Associated Problems: {Hx; Associated problems:210120515} Quality of Pain: {Hx; Symptom quality or Descriptor:210120531} Previous Examinations or Tests: {Hx; Previous examinations or test:210120529} Previous Treatments: {Hx; Previous Treatment:210120503}  ***  Today I took the time to provide the patient with information regarding my pain practice. The patient was informed that my practice is divided into two sections: an interventional pain management section, as well as a completely separate and distinct medication management section. I explained that I have procedure days for my interventional therapies, and evaluation days for follow-ups and medication management. Because of the amount of documentation required during both, they are kept separated. This means that there is the possibility that she may be scheduled for a procedure on one day, and medication management the next. I have also informed her that because of staffing and facility limitations, I no longer take patients for medication management only. To illustrate the reasons for this, I gave the patient the example of surgeons, and how inappropriate it would be to refer a patient to his/her care, just to write for the post-surgical antibiotics on a surgery done by a different surgeon.   Because interventional pain management is my board-certified specialty, the patient was informed that joining my practice means that they are open to any and all  interventional therapies. I made it clear that this does not mean that  they will be forced to have any procedures done. What this means is that I believe interventional therapies to be essential part of the diagnosis and proper management of chronic pain conditions. Therefore, patients not interested in these interventional alternatives will be better served under the care of a different practitioner.  The patient was also made aware of my Comprehensive Pain Management Safety Guidelines where by joining my practice, they limit all of their nerve blocks and joint injections to those done by our practice, for as long as we are retained to manage their care.   Historic Controlled Substance Pharmacotherapy Review  PMP and historical list of controlled substances: Tramadol 50 mg tablet, 1 tab p.o. twice daily; diazepam 10 mg tablet; alprazolam 0.5 mg tablet, 1 tab p.o. daily Current opioid analgesics: None MME/day: 0 mg/day  Historical Monitoring: The patient  reports no history of drug use. List of all UDS Test(s): No results found for: MDMA, COCAINSCRNUR, [place], [place], CANNABQUANT, THCU, [place] List of other Serum/Urine Drug Screening Test(s):  No results found for: AMPHSCRSER, BARBSCRSER, BENZOSCRSER, COCAINSCRSER, COCAINSCRNUR, PCPSCRSER, PCPQUANT, THCSCRSER, THCU, CANNABQUANT, OPIATESCRSER, OXYSCRSER, PROPOXSCRSER, ETH Historical Background Evaluation: [place] PMP: PDMP reviewed during this encounter. Online review of the past 17-month period conducted.             PMP NARX Score Report:  Narcotic: 200 Sedative: 130 Stimulant: 000 [place] Department of public safety, offender search: Editor, commissioning Information) Non-contributory Risk Assessment Profile: Aberrant behavior: None observed or detected today Risk factors for fatal opioid overdose: None identified today PMP NARX Overdose Risk Score: 310 Fatal overdose hazard ratio (HR): Calculation deferred Non-fatal overdose hazard ratio (HR): Calculation  deferred Risk of opioid abuse or dependence: 0.7-3.0% with doses ? 36 MME/day and 6.1-26% with doses ? 120 MME/day. Substance use disorder (SUD) risk level: See below Personal History of Substance Abuse (SUD-Substance use disorder):  Alcohol:    Illegal Drugs:    Rx Drugs:    ORT Risk Level calculation:    ORT Scoring interpretation table:  Score <3 = Low Risk for SUD  Score between 4-7 = Moderate Risk for SUD  Score >8 = High Risk for Opioid Abuse   PHQ-2 Depression Scale:  Total score:    PHQ-2 Scoring interpretation table: (Score and probability of major depressive disorder)  Score 0 = No depression  Score 1 = 15.4% Probability  Score 2 = 21.1% Probability  Score 3 = 38.4% Probability  Score 4 = 45.5% Probability  Score 5 = 56.4% Probability  Score 6 = 78.6% Probability   PHQ-9 Depression Scale:  Total score:    PHQ-9 Scoring interpretation table:  Score 0-4 = No depression  Score 5-9 = Mild depression  Score 10-14 = Moderate depression  Score 15-19 = Moderately severe depression  Score 20-27 = Severe depression (2.4 times higher risk of SUD and 2.89 times higher risk of overuse)   Pharmacologic Plan: As per protocol, I have not taken over any controlled substance management, pending the results of ordered tests and/or consults.            Initial impression: Pending review of available data and ordered tests.  Meds   Current Outpatient Medications:    buPROPion (WELLBUTRIN XL) 300 MG 24 hr tablet, TAKE 1 TABLET BY MOUTH EVERY DAY, Disp: 90 tablet, Rfl: 2   cyclobenzaprine (FLEXERIL) 10 MG tablet, Take 1 tablet (10 mg total) by mouth daily as needed for muscle spasms., Disp: 30 tablet, Rfl: 0   gabapentin (NEURONTIN)  300 MG capsule, Take 1 capsule (300 mg total) by mouth 3 (three) times daily., Disp: 90 capsule, Rfl: 11   hydrOXYzine (ATARAX/VISTARIL) 10 MG tablet, TAKE 1 TABLET BY MOUTH EVERY DAY AS NEEDED, Disp: 90 tablet, Rfl: 1   letrozole (FEMARA) 2.5 MG tablet,  Take 2.5 mg by mouth daily., Disp: , Rfl:    metoprolol succinate (TOPROL-XL) 100 MG 24 hr tablet, Take 1 tablet (100 mg total) by mouth daily. MUST SCHEDULE ANNUAL EXAM FOR REFILLS, Disp: 90 tablet, Rfl: 3   Multiple Vitamins-Minerals (ONE-A-DAY WOMENS 50 PLUS PO), Take 1 tablet by mouth daily at 12 noon., Disp: , Rfl:    predniSONE (DELTASONE) 10 MG tablet, Take 6 tabs on day 1, 5 tabs on day 2, 4 tabs on day 3, 3 tabs on day 4, 2 tabs on day 5, 1 tab on day 6, Disp: 21 tablet, Rfl: 0   sertraline (ZOLOFT) 100 MG tablet, Take 1 tablet (100 mg total) by mouth daily., Disp: 90 tablet, Rfl: 3   topiramate (TOPAMAX) 25 MG tablet, TAKE 4 TABLETS BY MOUTH DAILY., Disp: 360 tablet, Rfl: 0   traMADol (ULTRAM) 50 MG tablet, Take 2 tablets (100 mg total) by mouth daily as needed., Disp: 60 tablet, Rfl: 0   triamterene-hydrochlorothiazide (MAXZIDE-25) 37.5-25 MG tablet, Take 1 tablet by mouth daily., Disp: 90 tablet, Rfl: 0   zolmitriptan (ZOMIG) 5 MG tablet, TAKE 1 TABLET (5 MG TOTAL) BY MOUTH ONCE AS NEEDED. MAY REPEAT IN TWO HOURS AS NEEDED, Disp: 6 tablet, Rfl: 11   zolpidem (AMBIEN) 5 MG tablet, Take 1 tablet (5 mg total) by mouth at bedtime as needed for sleep., Disp: 30 tablet, Rfl: 0  Imaging Review  Cervical Imaging: Cervical DG complete: Results for orders placed during the hospital encounter of 03/02/19 DG Cervical Spine Complete  Narrative CLINICAL DATA:  Paresthesias of the upper extremities.  EXAM: CERVICAL SPINE - COMPLETE 4+ VIEW  COMPARISON:  None.  FINDINGS: Straightening of the normal cervical lordosis. Slight disc space narrowing at C5-6. Marked disc space narrowing at C6-7. Prevertebral soft tissues are normal. No foraminal stenosis. Slight facet arthritis at C2-3. No other abnormalities.  IMPRESSION: 1. Degenerative disc disease at C6-7 and to a lesser extent at C5-6. 2. Slight facet arthritis at C2-3.   Electronically Signed By: Lorriane Shire M.D. On: 03/02/2019  09:13  Lumbosacral Imaging: Lumbar DG (Complete) 4+V: Results for orders placed during the hospital encounter of 04/14/19 DG Lumbar Spine Complete  Narrative CLINICAL DATA:  Chronic low back pain.  EXAM: LUMBAR SPINE - COMPLETE 4+ VIEW  COMPARISON:  CT 08/15/2012.  FINDINGS: Surgical clips right upper quadrant. Mild diffuse degenerative change. Normal alignment and mineralization. No acute or focal bony abnormality identified. Mild degenerative changes both hips.  IMPRESSION: Mild diffuse degenerative change lumbar spine and both hips. No acute abnormality identified.   Electronically Signed By: Marcello Moores  Register On: 04/15/2019 05:50  Knee Imaging: Knee-L DG 4 views: Results for orders placed during the hospital encounter of 09/27/17 DG Knee Complete 4 Views Left  Narrative CLINICAL DATA:  Fall 1 week ago with persistent knee pain and swelling  EXAM: LEFT KNEE - COMPLETE 4+ VIEW  COMPARISON:  None.  FINDINGS: Lucency is noted in the lateral tibial plateau suspicious for a minimally displaced tibial plateau fracture. No other fractures are seen. No other focal abnormality is noted.  IMPRESSION: Changes suspicious for a lateral tibial plateau fracture. Cross-sectional imaging may be helpful for further evaluation as clinically  necessary.   Electronically Signed By: Alcide Clever M.D. On: 09/27/2017 16:37  Foot Imaging: Foot-R DG Complete: Results for orders placed in visit on 10/26/99 DG Foot Complete Right  Narrative FINDINGS IMPRESSION  Complexity Note: Imaging results reviewed. Results shared with Mallory Meyers, using Layman's terms.                        ROS  Cardiovascular: {Hx; Cardiovascular History:210120525} Pulmonary or Respiratory: {Hx; Pumonary and/or Respiratory History:210120523} Neurological: {Hx; Neurological:210120504} Psychological-Psychiatric: {Hx; Psychological-Psychiatric History:210120512} Gastrointestinal: {Hx;  Gastrointestinal:210120527} Genitourinary: {Hx; Genitourinary:210120506} Hematological: {Hx; Hematological:210120510} Endocrine: {Hx; Endocrine history:210120509} Rheumatologic: {Hx; Rheumatological:210120530} Musculoskeletal: {Hx; Musculoskeletal:210120528} Work History: {Hx; Work history:210120514}  Allergies  Mallory Meyers has No Known Allergies.  Laboratory Chemistry Profile   Renal Lab Results  Component Value Date   BUN 21 10/24/2020   CREATININE 1.03 10/24/2020   BCR NOT APPLICABLE 10/24/2020   GFR 62.43 04/14/2019   GFRAA 73 10/24/2020   GFRNONAA 63 10/24/2020   SPECGRAV >=1.030 (A) 01/13/2017   PHUR 6.0 01/13/2017   PROTEINUR neg 01/13/2017     Electrolytes Lab Results  Component Value Date   NA 138 10/24/2020   K 4.0 10/24/2020   CL 102 10/24/2020   CALCIUM 10.0 10/24/2020     Hepatic Lab Results  Component Value Date   AST 18 10/24/2020   ALT 17 10/24/2020   ALBUMIN 3.9 04/14/2019   ALKPHOS 58 04/14/2019   LIPASE 63 (L) 08/15/2012     ID Lab Results  Component Value Date   HIV NON-REACTIVE 10/24/2020   PREGTESTUR NEGATIVE 12/23/2014     Bone Lab Results  Component Value Date   VD25OH 21.91 (L) 03/02/2019     Endocrine Lab Results  Component Value Date   GLUCOSE 95 10/24/2020   GLUCOSEU Negative 08/15/2012   HGBA1C 5.6 10/24/2020   TSH 1.62 10/24/2020     Neuropathy Lab Results  Component Value Date   VITAMINB12 234 03/02/2019   HGBA1C 5.6 10/24/2020   HIV NON-REACTIVE 10/24/2020     CNS No results found for: COLORCSF, APPEARCSF, RBCCOUNTCSF, WBCCSF, POLYSCSF, LYMPHSCSF, EOSCSF, PROTEINCSF, GLUCCSF, JCVIRUS, CSFOLI, IGGCSF, LABACHR, ACETBL, LABACHR, ACETBL   Inflammation (CRP: Acute   ESR: Chronic) No results found for: CRP, ESRSEDRATE, LATICACIDVEN   Rheumatology No results found for: RF, ANA, LABURIC, URICUR, LYMEIGGIGMAB, LYMEABIGMQN, HLAB27   Coagulation Lab Results  Component Value Date   PLT 282 10/24/2020      Cardiovascular Lab Results  Component Value Date   HGB 13.7 10/24/2020   HCT 43.3 10/24/2020     Screening Lab Results  Component Value Date   HIV NON-REACTIVE 10/24/2020   PREGTESTUR NEGATIVE 12/23/2014     Cancer No results found for: CEA, CA125, LABCA2   Allergens No results found for: ALMOND, APPLE, ASPARAGUS, AVOCADO, BANANA, BARLEY, BASIL, BAYLE[place], GREENBEAN, LIMABEAN, WHITEBEAN, BEEFIGE, REDBEET, BLUEBERRY, BROCCOLI, CABBAGE, MELON, CARROT, CASEIN, CASHEWNUT, CAULIFLOWER, CELERY     Note: Lab results reviewed.  PFSH  Drug: Mallory Meyers  reports no history of drug use. Alcohol:  reports current alcohol use. Tobacco:  reports that she has never smoked. She has never used smokeless tobacco. Medical:  has a past medical history of Anemia, Breast cancer (HCC) (04/2018), Depression, Headache, Hypertension, Polycystic ovaries, and Seizures (HCC). Family: family history includes Colon polyps in her mother; Diabetes in her mother; Hypertension in her maternal grandmother and mother; Kidney disease in her mother; Lung disease in her mother.  Past Surgical History:  Procedure Laterality Date   CHOLECYSTECTOMY  1997   COLONOSCOPY WITH PROPOFOL N/A 09/25/2016   Procedure: COLONOSCOPY WITH PROPOFOL;  Surgeon: Christene Lye, MD;  Location: [facility] ENDOSCOPY;  Service: Endoscopy;  Laterality: N/A;   KNEE SURGERY Left    TOE ARTHROPLASTY Right 12/23/2014   Procedure: TOE ARTHROPLASTY;  Surgeon: Sharlotte Alamo, MD;  Location: [facility] ORS;  Service: Podiatry;  Laterality: Right;   Active Ambulatory Problems    Diagnosis Date Noted   Prediabetes 11/29/2014   Vitamin D deficiency 11/29/2014   Intractable periodic headache syndrome 11/29/2014   Hypertension 08/14/2015   Anemia 07/09/2016   Allergic rhinitis due to allergen 07/09/2016   Chronic low back pain 04/30/2017   Anxiety and depression 03/10/2018   Insomnia due to other mental disorder 03/10/2018   History of breast cancer  06/13/2018   Class 2 severe obesity due to excess calories with serious comorbidity and body mass index (BMI) of 36.0 to 36.9 in adult [facility]) 10/24/2020   Benign essential hypertension 03/22/2020   Infiltrating ductal carcinoma of right breast ([place]) 07/06/2018   Malignant neoplasm of upper-outer quadrant of right breast in female, estrogen receptor positive ([place]) 05/26/2018   Migraine, chronic, without aura, intractable 03/22/2020   Mixed hyperlipidemia 05/03/2020   Panic attack 03/22/2020   Thoracic radiculopathy 06/23/2020   Depression, recurrent ([place]) 07/09/2016   Back pain at L4-L5 level 05/03/2020   Chronic pain syndrome 04/17/2021   Pharmacologic therapy 04/17/2021   Disorder of skeletal system 04/17/2021   Problems influencing health status 04/17/2021   Osteoarthritis of hips (Bilateral) 04/17/2021   DDD (degenerative disc disease), lumbosacral 04/17/2021   DDD (degenerative disc disease), cervical 04/17/2021   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) 07/09/2016   BMI 39.0-39.9,adult 08/30/2016   Encounter for surveillance of vaginal ring hormonal contraceptive device 11/20/2017   Past Medical History:  Diagnosis Date   Breast cancer ([place]) 04/2018   Depression    Headache    Polycystic ovaries    Seizures ([place])    Constitutional Exam  General appearance: Well nourished, well developed, and well hydrated. In no apparent acute distress There were no vitals filed for this visit. BMI Assessment: Estimated body mass index is 37.22 kg/m as calculated from the following:   Height as of 03/12/21: 5' (1.524 m).   Weight as of 03/12/21: 190 lb 9.6  oz (86.5 kg).  BMI interpretation table: BMI level Category Range association with higher incidence of chronic pain  <18 kg/m2 Underweight   18.5-24.9 kg/m2 Ideal body weight   25-29.9 kg/m2 Overweight Increased incidence by 20%  30-34.9 kg/m2 Obese (Class I) Increased incidence by 68%  35-39.9 kg/m2 Severe obesity (Class II) Increased incidence by 136%  >40 kg/m2 Extreme obesity (Class III) Increased incidence by 254%   Patient's current BMI Ideal Body weight  There is no height or weight on file to calculate BMI. Patient weight not recorded   BMI Readings from Last 4 Encounters:  03/12/21 37.22 kg/m  10/24/20 36.91 kg/m  04/14/19 37.50 kg/m  03/02/19 35.94 kg/m   Wt Readings from Last 4 Encounters:  03/12/21 190 lb 9.6 oz (86.5 kg)  10/24/20 189 lb (  85.7 kg)  04/14/19 192 lb (87.1 kg)  03/02/19 184 lb (83.5 kg)    Psych/Mental status: Alert, oriented x 3 (person, place, & time)       Eyes: PERLA Respiratory: No evidence of acute respiratory distress  Assessment  Primary Diagnosis & Pertinent Problem List: The primary encounter diagnosis was Chronic pain syndrome. 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  Visit Diagnosis (New problems to examiner): 1. Chronic pain syndrome   2. Pharmacologic therapy   3. Disorder of skeletal system   4. Problems influencing health status   5. Osteoarthritis of hips (Bilateral)   6. DDD (degenerative disc disease), lumbosacral   7. DDD (degenerative disc disease), cervical   8. 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 We have not taken over the patient's  controlled substance management.  Problem-specific plan: No problem-specific Assessment & Plan notes found for this encounter.  Lab Orders  No laboratory test(s) ordered today   Imaging Orders  No imaging studies ordered today   Referral Orders  No referral(s) requested today   Procedure Orders    No procedure(s) ordered today   Pharmacotherapy (current): Medications ordered:  No orders of the defined types were placed in this encounter.  Medications administered during this visit: Larkin S. Martinezgarcia had no medications administered during this visit.   Pharmacological management options:  Opioid Analgesics: The patient was informed that there is no guarantee that she would be a candidate for opioid analgesics. The decision will be made following CDC guidelines. This decision will be based on the results of diagnostic studies, as well as Mallory Meyers's risk profile.   Membrane stabilizer: To be determined at a later time  Muscle relaxant: To be determined at a later time  NSAID: To be determined at a later time  Other analgesic(s): To be determined at a later time   Interventional management options: Mallory Meyers was informed that there is no guarantee that she would be a candidate for interventional therapies. The decision will be based on the results of diagnostic studies, as well as Mallory Meyers's risk profile.  Procedure(s) under consideration:  Pending results of ordered studies      Interventional Therapies  Risk   Complexity Considerations:   Estimated body mass index is 37.22 kg/m as calculated from the following:   Height as of 03/12/21: 5' (1.524 m).   Weight as of 03/12/21: 190 lb 9.6 oz (86.5 kg). WNL   Planned   Pending:   Pending further evaluation   Under consideration:   ***   Completed:   None at this time   Therapeutic   Palliative (PRN) options:   None established    Provider-requested follow-up: No follow-ups on file.  Future Appointments   Date Time Provider [place]  04/18/2021  9:00 AM Milinda Pointer, MD [facility] None    Note by: Gaspar Cola, MD Date: 04/18/2021; Time: 7:27 AM

## 2021-04-18 ENCOUNTER — Ambulatory Visit: Payer: Self-pay | Admitting: Pain Medicine

## 2021-05-02 ENCOUNTER — Other Ambulatory Visit: Payer: Self-pay

## 2021-05-02 ENCOUNTER — Encounter: Payer: Self-pay | Admitting: Student in an Organized Health Care Education/Training Program

## 2021-05-02 ENCOUNTER — Ambulatory Visit
Payer: No Typology Code available for payment source | Attending: Pain Medicine | Admitting: Student in an Organized Health Care Education/Training Program

## 2021-05-02 VITALS — BP 130/58 | HR 83 | Temp 99.2°F | Resp 18 | Ht 60.0 in | Wt 186.0 lb

## 2021-05-02 DIAGNOSIS — M5124 Other intervertebral disc displacement, thoracic region: Secondary | ICD-10-CM

## 2021-05-02 DIAGNOSIS — M4724 Other spondylosis with radiculopathy, thoracic region: Secondary | ICD-10-CM | POA: Insufficient documentation

## 2021-05-02 DIAGNOSIS — G894 Chronic pain syndrome: Secondary | ICD-10-CM

## 2021-05-02 DIAGNOSIS — M5137 Other intervertebral disc degeneration, lumbosacral region: Secondary | ICD-10-CM | POA: Insufficient documentation

## 2021-05-02 DIAGNOSIS — Z853 Personal history of malignant neoplasm of breast: Secondary | ICD-10-CM

## 2021-05-02 DIAGNOSIS — M51379 Other intervertebral disc degeneration, lumbosacral region without mention of lumbar back pain or lower extremity pain: Secondary | ICD-10-CM

## 2021-05-02 NOTE — Progress Notes (Signed)
Patient: Mallory Meyers  Service Category: E/M  Provider: Gillis Santa, MD  DOB: [birth year]  DOS: 05/02/2021  Referring Provider: Jearld Fenton, NP  MRN: [id number]  Setting: Ambulatory outpatient  PCP: Mallory Fenton, NP  Type: New Patient  Specialty: Interventional Pain Management    Location: Office  Delivery: Face-to-face     Primary Reason(s) for Visit: Encounter for initial evaluation of one or more chronic problems (new to examiner) potentially causing chronic pain, and posing a threat to normal musculoskeletal function. (Level of risk: High) CC: Back Pain (low)  HPI  Mallory Meyers is a 51 y.o. year old, female patient, who comes for the first time to our practice referred by Mallory Fenton, NP for our initial evaluation of her chronic pain. She has Prediabetes; Vitamin D deficiency; Intractable periodic headache syndrome; Hypertension; Anemia; Allergic rhinitis due to allergen; Chronic low back pain; Anxiety and depression; Insomnia due to other mental disorder; History of breast cancer; Class 2 severe obesity due to excess calories with serious comorbidity and body mass index (BMI) of 36.0 to 36.9 in adult [facility]); Benign essential hypertension; Infiltrating ductal carcinoma of right breast ([place]); Malignant neoplasm of upper-outer quadrant of right breast in female, estrogen receptor positive ([place]); Migraine, chronic, without aura, intractable; Mixed hyperlipidemia; Panic attack; Thoracic radiculopathy; Depression, recurrent ([place]); Back pain at L4-L5 level; Chronic pain syndrome; Pharmacologic therapy; Disorder of skeletal system; Problems influencing health status; Osteoarthritis of hips (Bilateral); DDD (degenerative disc disease), lumbosacral; DDD (degenerative disc disease), cervical; Cervical facet arthritis (C2-3); Paresthesia of upper extremity (Bilateral); Thoracic disc herniation; and 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 Today she comes in  for evaluation of her Back Pain (low)  Pain Assessment: Location: Lower Back Radiating: around to the right hip at times Onset: More than a month ago Duration: Chronic pain Quality: Constant, Throbbing Severity: 7 /10 (subjective, self-reported pain score)  Effect on ADL:   Timing: Constant Modifying factors: medications, rest, hot showers BP: (!) 130/58   HR: 83  Onset and Duration: Gradual Cause of pain: Unknown Severity: No change since onset, NAS-11 at its worse: 8/10, NAS-11 at its best: 5/10, NAS-11 now: 6/10, and NAS-11 on the average: 6/10 Timing: During activity or exercise Aggravating Factors: Bending, Lifiting, Prolonged sitting, and Working Alleviating Factors: Medications, Resting, and Warm showers or baths Associated Problems: Spasms Quality of Pain: Annoying, Constant, Sharp, and Uncomfortable Previous Examinations or Tests: MRI scan and X-rays Previous Treatments: Epidural steroid injections  Mallory Meyers is a pleasant 51 year old female who presents with a chief complaint of mid back pain and low back pain that does radiate at times to her hips.  She was previously being seen at [facility] for pain management however she has moved to [facility] and would like to have her epidural injections continued here.  She has had 2 thoracic epidural steroid injections this first one at T12/L1 that provided 60% pain relief on 07/14/2020 followed by second 1 at T11-T12 on 09/01/2020.  She does find benefit with the injections.  Of note she has a history of breast cancer status post lumpectomy, chemotherapy and radiation.  She has been going to physical therapy and tries to do home exercises as well.  She has a history of anxiety and depression that is currently managed by her PCP.  She also has a history of migraines.  Prediabetic with A1c 5.5.   Historic Controlled Substance Pharmacotherapy Review  PMP and historical list of controlled  substances: Tramadol 50 mg BID prn  Historical  Monitoring: The patient  reports no history of drug use. List of all UDS Test(s): No results found for: MDMA, COCAINSCRNUR, [place], [place], CANNABQUANT, [place], [place] List of other Serum/Urine Drug Screening Test(s):  No results found for: AMPHSCRSER, BARBSCRSER, BENZOSCRSER, COCAINSCRSER, COCAINSCRNUR, PCPSCRSER, PCPQUANT, THCSCRSER, THCU, CANNABQUANT, OPIATESCRSER, OXYSCRSER, PROPOXSCRSER, ETH Historical Background Evaluation: [place] PMP: PDMP not reviewed this encounter. Online review of the past 44-monthperiod conducted.             [place] Department of public safety, offender search: (Editor, commissioningInformation) Non-contributory Risk Assessment Profile: Aberrant behavior: None observed or detected today Risk factors for fatal opioid overdose: None identified today Fatal overdose hazard ratio (HR): Calculation deferred Non-fatal overdose hazard ratio (HR): Calculation deferred Risk of opioid abuse or dependence: 0.7-3.0% with doses ? 36 MME/day and 6.1-26% with doses ? 120 MME/day. Substance use disorder (SUD) risk level: Low   Pharmacologic Plan: Medication management to continue with PCP.  We will focus on interventional pain management.  Meds   Current Outpatient Medications:    buPROPion (WELLBUTRIN XL) 300 MG 24 hr tablet, TAKE 1 TABLET BY MOUTH EVERY DAY, Disp: 90 tablet, Rfl: 2   cyclobenzaprine (FLEXERIL) 10 MG tablet, Take 1 tablet (10 mg total) by mouth daily as needed for muscle spasms., Disp: 30 tablet, Rfl: 0   gabapentin (NEURONTIN) 300 MG capsule, Take 1 capsule (300 mg total) by mouth 3 (three) times daily., Disp: 90 capsule, Rfl: 11   hydrOXYzine (ATARAX/VISTARIL) 10 MG tablet, TAKE 1 TABLET BY MOUTH EVERY DAY AS NEEDED, Disp: 90 tablet, Rfl: 1   letrozole (FEMARA) 2.5 MG tablet, Take 2.5 mg by mouth daily., Disp: , Rfl:    metoprolol succinate (TOPROL-XL) 100 MG 24 hr tablet, Take 1 tablet (100 mg total) by mouth daily. MUST SCHEDULE ANNUAL EXAM FOR REFILLS, Disp: 90 tablet, Rfl:  3   Multiple Vitamins-Minerals (ONE-A-DAY WOMENS 50 PLUS PO), Take 1 tablet by mouth daily at 12 noon., Disp: , Rfl:    sertraline (ZOLOFT) 100 MG tablet, Take 1 tablet (100 mg total) by mouth daily., Disp: 90 tablet, Rfl: 3   topiramate (TOPAMAX) 25 MG tablet, TAKE 4 TABLETS BY MOUTH DAILY., Disp: 360 tablet, Rfl: 0   traMADol (ULTRAM) 50 MG tablet, Take 2 tablets (100 mg total) by mouth daily as needed., Disp: 60 tablet, Rfl: 0   triamterene-hydrochlorothiazide (MAXZIDE-25) 37.5-25 MG tablet, Take 1 tablet by mouth daily., Disp: 90 tablet, Rfl: 0   zolmitriptan (ZOMIG) 5 MG tablet, TAKE 1 TABLET (5 MG TOTAL) BY MOUTH ONCE AS NEEDED. MAY REPEAT IN TWO HOURS AS NEEDED, Disp: 6 tablet, Rfl: 11   zolpidem (AMBIEN) 5 MG tablet, Take 1 tablet (5 mg total) by mouth at bedtime as needed for sleep., Disp: 30 tablet, Rfl: 0   predniSONE (DELTASONE) 10 MG tablet, Take 6 tabs on day 1, 5 tabs on day 2, 4 tabs on day 3, 3 tabs on day 4, 2 tabs on day 5, 1 tab on day 6 (Patient not taking: Reported on 05/02/2021), Disp: 21 tablet, Rfl: 0  Imaging Review    Narrative CLINICAL DATA:  Paresthesias of the upper extremities.  EXAM: CERVICAL SPINE - COMPLETE 4+ VIEW  COMPARISON:  None.  FINDINGS: Straightening of the normal cervical lordosis. Slight disc space narrowing at C5-6. Marked disc space narrowing at C6-7. Prevertebral soft tissues are normal. No foraminal stenosis. Slight facet arthritis at C2-3. No other abnormalities.  IMPRESSION: 1.  Degenerative disc disease at C6-7 and to a lesser extent at C5-6. 2. Slight facet arthritis at C2-3.   Electronically Signed By: Lorriane Shire M.D. On: 03/02/2019 09:13  DG Lumbar Spine Complete  Narrative CLINICAL DATA:  Chronic low back pain.  EXAM: LUMBAR SPINE - COMPLETE 4+ VIEW  COMPARISON:  CT 08/15/2012.  FINDINGS: Surgical clips right upper quadrant. Mild diffuse degenerative change. Normal alignment and mineralization. No acute or focal  bony abnormality identified. Mild degenerative changes both hips.  IMPRESSION: Mild diffuse degenerative change lumbar spine and both hips. No acute abnormality identified.   Electronically Signed By: Marcello Moores  Register On: 04/15/2019 05:50  CLINICAL DATA:  Fall 1 week ago with persistent knee pain and swelling  EXAM: LEFT KNEE - COMPLETE 4+ VIEW  COMPARISON:  None.  FINDINGS: Lucency is noted in the lateral tibial plateau suspicious for a minimally displaced tibial plateau fracture. No other fractures are seen. No other focal abnormality is noted.  IMPRESSION: Changes suspicious for a lateral tibial plateau fracture. Cross-sectional imaging may be helpful for further evaluation as clinically necessary.   Electronically Signed By: Inez Catalina M.D. On: 09/27/2017 16:37  Complexity Note: Imaging results reviewed. Results shared with Ms. Crego, using Layman's terms.                         ROS  Cardiovascular: No reported cardiovascular signs or symptoms such as High blood pressure, coronary artery disease, abnormal heart rate or rhythm, heart attack, blood thinner therapy or heart weakness and/or failure Pulmonary or Respiratory: No reported pulmonary signs or symptoms such as wheezing and difficulty taking a deep full breath (Asthma), difficulty blowing air out (Emphysema), coughing up mucus (Bronchitis), persistent dry cough, or temporary stoppage of breathing during sleep Neurological: No reported neurological signs or symptoms such as seizures, abnormal skin sensations, urinary and/or fecal incontinence, being born with an abnormal open spine and/or a tethered spinal cord Psychological-Psychiatric: Anxiousness, Depressed, Prone to panicking, and No reported psychological or psychiatric signs or symptoms such as difficulty sleeping, anxiety, depression, delusions or hallucinations (schizophrenial), mood swings (bipolar disorders) or suicidal ideations or  attempts Gastrointestinal: No reported gastrointestinal signs or symptoms such as vomiting or evacuating blood, reflux, heartburn, alternating episodes of diarrhea and constipation, inflamed or scarred liver, or pancreas or irrregular and/or infrequent bowel movements Genitourinary: No reported renal or genitourinary signs or symptoms such as difficulty voiding or producing urine, peeing blood, non-functioning kidney, kidney stones, difficulty emptying the bladder, difficulty controlling the flow of urine, or chronic kidney disease Hematological: No reported hematological signs or symptoms such as prolonged bleeding, low or poor functioning platelets, bruising or bleeding easily, hereditary bleeding problems, low energy levels due to low hemoglobin or being anemic Endocrine: No reported endocrine signs or symptoms such as high or low blood sugar, rapid heart rate due to high thyroid levels, obesity or weight gain due to slow thyroid or thyroid disease Rheumatologic: No reported rheumatological signs and symptoms such as fatigue, joint pain, tenderness, swelling, redness, heat, stiffness, decreased range of motion, with or without associated rash Musculoskeletal: Negative for myasthenia gravis, muscular dystrophy, multiple sclerosis or malignant hyperthermia Work History: Working full time  Allergies  Ms. Mergen has No Known Allergies.  Laboratory Chemistry Profile   Renal Lab Results  Component Value Date   BUN 21 10/24/2020   CREATININE 1.03 20/94/7096   BCR NOT APPLICABLE 28/36/6294   GFR 62.43 04/14/2019   GFRAA 73 10/24/2020  GFRNONAA 63 10/24/2020   SPECGRAV >=1.030 (A) 01/13/2017   PHUR 6.0 01/13/2017   PROTEINUR neg 01/13/2017     Electrolytes Lab Results  Component Value Date   NA 138 10/24/2020   K 4.0 10/24/2020   CL 102 10/24/2020   CALCIUM 10.0 10/24/2020     Hepatic Lab Results  Component Value Date   AST 18 10/24/2020   ALT 17 10/24/2020   ALBUMIN 3.9  04/14/2019   ALKPHOS 58 04/14/2019   LIPASE 63 (L) 08/15/2012     ID Lab Results  Component Value Date   HIV NON-REACTIVE 10/24/2020   PREGTESTUR NEGATIVE 12/23/2014     Bone Lab Results  Component Value Date   VD25OH 21.91 (L) 03/02/2019     Endocrine Lab Results  Component Value Date   GLUCOSE 95 10/24/2020   GLUCOSEU Negative 08/15/2012   HGBA1C 5.6 10/24/2020   TSH 1.62 10/24/2020     Neuropathy Lab Results  Component Value Date   VITAMINB12 234 03/02/2019   HGBA1C 5.6 10/24/2020   HIV NON-REACTIVE 10/24/2020     CNS No results found for: COLORCSF, APPEARCSF, RBCCOUNTCSF, WBCCSF, POLYSCSF, LYMPHSCSF, EOSCSF, PROTEINCSF, GLUCCSF, JCVIRUS, CSFOLI, IGGCSF, LABACHR, ACETBL, LABACHR, ACETBL   Inflammation (CRP: Acute   ESR: Chronic) No results found for: CRP, ESRSEDRATE, LATICACIDVEN   Rheumatology No results found for: RF, ANA, LABURIC, URICUR, LYMEIGGIGMAB, LYMEABIGMQN, HLAB27   Coagulation Lab Results  Component Value Date   PLT 282 10/24/2020     Cardiovascular Lab Results  Component Value Date   HGB 13.7 10/24/2020   HCT 43.3 10/24/2020     Screening Lab Results  Component Value Date   HIV NON-REACTIVE 10/24/2020   PREGTESTUR NEGATIVE 12/23/2014     Cancer No results found for: CEA, CA125, LABCA2   Allergens No results found for: ALMOND, APPLE, ASPARAGUS, AVOCADO, BANANA, BARLEY, BASIL, BAYLEAF, GREENBEAN, LIMABEAN, WHITEBEAN, BEEFIGE, REDBEET, BLUEBERRY, BROCCOLI, CABBAGE, MELON, CARROT, CASEIN, CASHEWNUT, CAULIFLOWER, CELERY     Note: Lab results reviewed.  PFSH  Drug: Ms. Shepardson  reports no history of drug use. Alcohol:  reports current alcohol use. Tobacco:  reports that she has never smoked. She has never used smokeless tobacco. Medical:  has a past medical history of Anemia, Breast cancer ([place]) (04/2018), Depression, Headache, Hypertension, Polycystic ovaries, and Seizures ([place]). Family: family history includes Colon polyps in her  mother; Diabetes in her mother; Hypertension in her maternal grandmother and mother; Kidney disease in her mother; Lung disease in her mother.  Past Surgical History:  Procedure Laterality Date   CHOLECYSTECTOMY  1997   COLONOSCOPY WITH PROPOFOL N/A 09/25/2016   Procedure: COLONOSCOPY WITH PROPOFOL;  Surgeon: Christene Lye, MD;  Location: [facility] END[facility];  Service: Endoscopy;  Laterality: N/A;   KNEE SURGERY Left    TOE ARTHROPLASTY Right 12/23/2014   Procedure: TOE ARTHROPLASTY;  Surgeon: Sharlotte Alamo, MD;  Location: [facility] ORS;  Service: Podiatry;  Laterality: Right;   Active Ambulatory Problems    Diagnosis Date Noted   Prediabetes 11/29/2014   Vitamin D deficiency 11/29/2014   Intractable periodic headache syndrome 11/29/2014   Hypertension 08/14/2015   Anemia 07/09/2016   Allergic rhinitis due to allergen 07/09/2016   Chronic low back pain 04/30/2017   Anxiety and depression 03/10/2018   Insomnia due to other mental disorder 03/10/2018   History of breast cancer 06/13/2018   Class 2 severe obesity due to excess calories with serious comorbidity and body mass index (BMI) of 36.0 to 36.9 in adult [facility])  10/24/2020   Benign essential hypertension 03/22/2020   Infiltrating ductal carcinoma of right breast ([place]) 07/06/2018   Malignant neoplasm of upper-outer quadrant of right breast in female, estrogen receptor positive ([place]) 05/26/2018   Migraine, chronic, without aura, intractable 03/22/2020   Mixed hyperlipidemia 05/03/2020   Panic attack 03/22/2020   Thoracic radiculopathy 06/23/2020   Depression, recurrent ([place]) 07/09/2016   Back pain at L4-L5 level 05/03/2020   Chronic pain syndrome 04/17/2021   Pharmacologic therapy 04/17/2021   Disorder of skeletal system 04/17/2021   Problems influencing health status 04/17/2021   Osteoarthritis of hips (Bilateral) 04/17/2021   DDD (degenerative disc disease), lumbosacral 04/17/2021   DDD (degenerative disc disease), cervical  04/17/2021   Cervical facet arthritis (C2-3) 04/17/2021   Paresthesia of upper extremity (Bilateral) 04/17/2021   Thoracic disc herniation 05/02/2021   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)    Constitutional Exam  General appearance: Well nourished, well developed, and well hydrated. In no apparent acute distress Vitals:   05/02/21 1355  BP: (!) 130/58  Pulse: 83  Resp: 18  Temp: 99.2 F (37.3 C)  TempSrc: Oral  SpO2: 100%  Weight: 186 lb (84.4 kg)  Height: 5' (1.524 m)   BMI Assessment: Estimated body mass index is 36.33 kg/m as calculated from the following:   Height as of this encounter: 5' (1.524 m).   Weight as of this encounter: 186 lb (84.4 kg).  BMI interpretation table: BMI level Category Range association with higher incidence of chronic pain  <18 kg/m2 Underweight   18.5-24.9 kg/m2 Ideal body weight   25-29.9 kg/m2 Overweight Increased incidence by 20%  30-34.9 kg/m2 Obese (Class I) Increased incidence by 68%  35-39.9 kg/m2 Severe obesity (Class II) Increased incidence by 136%  >40 kg/m2 Extreme obesity (Class III) Increased incidence by 254%   Patient's current BMI Ideal Body weight  Body mass index is 36.33 kg/m. Ideal body weight: 45.5 kg (100 lb 4.9 oz) Adjusted ideal body weight: 61 kg (134 lb 9.4 oz)   BMI Readings from Last 4 Encounters:  05/02/21 36.33 kg/m  03/12/21  37.22 kg/m  10/24/20 36.91 kg/m  04/14/19 37.50 kg/m   Wt Readings from Last 4 Encounters:  05/02/21 186 lb (84.4 kg)  03/12/21 190 lb 9.6 oz (86.5 kg)  10/24/20 189 lb (85.7 kg)  04/14/19 192 lb (87.1 kg)    Psych/Mental status: Alert, oriented x 3 (person, place, & time)       Eyes: PERLA Respiratory: No evidence of acute respiratory distress  Thoracic Spine Area Exam  Skin & Axial Inspection: No masses, redness, or swelling Alignment: Symmetrical Functional ROM: Pain restricted ROM Stability: No instability detected Muscle Tone/Strength: Functionally intact. No obvious neuro-muscular anomalies detected. Sensory (Neurological): Dermatomal pain pattern Muscle strength & Tone: No palpable anomalies Lumbar Spine Area Exam  Skin & Axial Inspection: No masses, redness, or swelling Alignment: Symmetrical Functional ROM:  Pain restricted ROM       Stability: No instability detected Muscle Tone/Strength: Functionally intact. No obvious neuro-muscular anomalies detected. Sensory (Neurological): Dermatomal pain pattern  Gait & Posture Assessment  Ambulation: Unassisted Gait: Relatively normal for age and body habitus Posture: WNL  Lower Extremity Exam    Side: Right lower extremity  Side: Left lower extremity  Stability: No instability observed          Stability: No instability observed          Skin & Extremity Inspection: Skin color, temperature, and hair growth are WNL. No peripheral edema or cyanosis. No masses, redness, swelling, asymmetry, or associated skin lesions. No contractures.  Skin & Extremity Inspection: Skin color, temperature, and hair growth are WNL. No peripheral edema or cyanosis. No masses, redness, swelling, asymmetry, or associated skin lesions. No contractures.  Functional ROM: Unrestricted ROM                  Functional ROM: Unrestricted ROM                  Muscle Tone/Strength: Functionally intact. No obvious neuro-muscular anomalies detected.  Muscle  Tone/Strength: Functionally intact. No obvious neuro-muscular anomalies detected.  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 Diagnoses of Thoracic disc herniation, DDD (degenerative disc disease), lumbosacral, History of breast cancer, and Chronic pain syndrome were also pertinent to this visit.  Visit Diagnosis (New problems to examiner): 1. 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 Thoracic disc herniation   3. DDD (degenerative disc disease), lumbosacral   4. History of breast cancer   5. Chronic pain syndrome    Plan of Care (Initial workup plan)  General Recommendations: The pain condition that the patient suffers from is best treated with a multidisciplinary approach that involves an increase in physical activity to prevent de-conditioning and worsening of the pain cycle, as well as psychological counseling (formal and/or informal) to address the co-morbid psychological affects of pain. Treatment will often involve judicious use of pain medications and interventional procedures to decrease the pain, allowing the patient to participate in the physical activity that will ultimately produce long-lasting pain reductions. The goal of the multidisciplinary approach is to return the patient to a higher level of overall function and to restore their ability to perform activities of daily living.  -Med management to continue with PCP -Consider Botox, Roselyn Meier, for migraine management.  Happy to assist patient with this. -T12-L1 ESI as below.   She has received 2 of these in the past at [facility] and found them beneficial.  Orders Placed This Encounter  Procedures   Thoracic Epidural Injection    Standing Status:   Future    Standing Expiration Date:   05/02/2022    Scheduling Instructions:     Side: Midline     Without sedation    Order Specific Question:   Where will this procedure be performed?    Answer:   [facility]      Procedure Orders         Thoracic  Epidural Injection     Provider-requested follow-up: Return in about 2 weeks (around 05/16/2021) for T12,L1 ESI.Marland Kitchen po valium . I spent a total of 60 minutes reviewing chart data, face-to-face evaluation with the patient, counseling and coordination of care as detailed above.   No future appointments.  Note by: Mallory Santa, MD Date: 05/02/2021; Time: 3:16 PM

## 2021-05-02 NOTE — Progress Notes (Signed)
Safety precautions to be maintained throughout the outpatient stay will include: orient to surroundings, keep bed in low position, maintain call bell within reach at all times, provide assistance with transfer out of bed and ambulation.  

## 2021-05-02 NOTE — Patient Instructions (Addendum)
____________________________________________________________________________________________  General Risks and Possible Complications  Patient Responsibilities: It is important that you read this as it is part of your informed consent. It is our duty to inform you of the risks and possible complications associated with treatments offered to you. It is your responsibility as a patient to read this and to ask questions about anything that is not clear or that you believe was not covered in this document.  Patients Rights: You have the right to refuse treatment. You also have the right to change your mind, even after initially having agreed to have the treatment done. However, under this last option, if you wait until the last second to change your mind, you may be charged for the materials used up to that point.  Introduction: Medicine is not an Chief Strategy Officer. Everything in Medicine, including the lack of treatment(s), carries the potential for danger, harm, or loss (which is by definition: Risk). In Medicine, a complication is a secondary problem, condition, or disease that can aggravate an already existing one. All treatments carry the risk of possible complications. The fact that a side effects or complications occurs, does not imply that the treatment was conducted incorrectly. It must be clearly understood that these can happen even when everything is done following the highest safety standards.  No treatment: You can choose not to proceed with the proposed treatment alternative. The PRO(s) would include: avoiding the risk of complications associated with the therapy. The CON(s) would include: not getting any of the treatment benefits. These benefits fall under one of three categories: diagnostic; therapeutic; and/or palliative. Diagnostic benefits include: getting information which can ultimately lead to improvement of the disease or symptom(s). Therapeutic benefits are those associated with the  successful treatment of the disease. Finally, palliative benefits are those related to the decrease of the primary symptoms, without necessarily curing the condition (example: decreasing the pain from a flare-up of a chronic condition, such as incurable terminal cancer).  General Risks and Complications: These are associated to most interventional treatments. They can occur alone, or in combination. They fall under one of the following six (6) categories: no benefit or worsening of symptoms; bleeding; infection; nerve damage; allergic reactions; and/or death. No benefits or worsening of symptoms: In Medicine there are no guarantees, only probabilities. No healthcare provider can ever guarantee that a medical treatment will work, they can only state the probability that it may. Furthermore, there is always the possibility that the condition may worsen, either directly, or indirectly, as a consequence of the treatment. Bleeding: This is more common if the patient is taking a blood thinner, either prescription or over the counter (example: Goody Powders, Fish oil, Aspirin, Garlic, etc.), or if suffering a condition associated with impaired coagulation (example: Hemophilia, cirrhosis of the liver, low platelet counts, etc.). However, even if you do not have one on these, it can still happen. If you have any of these conditions, or take one of these drugs, make sure to notify your treating physician. Infection: This is more common in patients with a compromised immune system, either due to disease (example: diabetes, cancer, human immunodeficiency virus [HIV], etc.), or due to medications or treatments (example: therapies used to treat cancer and rheumatological diseases). However, even if you do not have one on these, it can still happen. If you have any of these conditions, or take one of these drugs, make sure to notify your treating physician. Nerve Damage: This is more common when the treatment is an invasive  one, but it can also happen with the use of medications, such as those used in the treatment of cancer. The damage can occur to small secondary nerves, or to large primary ones, such as those in the spinal cord and brain. This damage may be temporary or permanent and it may lead to impairments that can range from temporary numbness to permanent paralysis and/or brain death. Allergic Reactions: Any time a substance or material comes in contact with our body, there is the possibility of an allergic reaction. These can range from a mild skin rash (contact dermatitis) to a severe systemic reaction (anaphylactic reaction), which can result in death. Death: In general, any medical intervention can result in death, most of the time due to an unforeseen complication. ____________________________________________________________________________________________ ______________________________________________________________________  Preparing for Procedure with Sedation  NOTICE: Due to recent regulatory changes, starting on November 27, 2020, procedures requiring intravenous (IV) sedation will no longer be performed at the [facility].  These types of procedures are required to be performed at [facility] ambulatory surgery facility.  We are very sorry for the inconvenience.  Procedure appointments are limited to planned procedures: No Prescription Refills. No disability issues will be discussed. No medication changes will be discussed.  Instructions: Oral Intake: Do not eat or drink anything for at least 8 hours prior to your procedure. (Exception: Blood Pressure Medication. See below.) Transportation: A driver is required. You may not drive yourself after the procedure. Blood Pressure Medicine: Do not forget to take your blood pressure medicine with a sip of water the morning of the procedure. If your Diastolic (lower reading) is above 100 mmHg, elective cases will be cancelled/rescheduled. Blood thinners:  These will need to be stopped for procedures. Notify our staff if you are taking any blood thinners. Depending on which one you take, there will be specific instructions on how and when to stop it. Diabetics on insulin: Notify the staff so that you can be scheduled 1st case in the morning. If your diabetes requires high dose insulin, take only  of your normal insulin dose the morning of the procedure and notify the staff that you have done so. Preventing infections: Shower with an antibacterial soap the morning of your procedure. Build-up your immune system: Take 1000 mg of Vitamin C with every meal (3 times a day) the day prior to your procedure. Antibiotics: Inform the staff if you have a condition or reason that requires you to take antibiotics before dental procedures. Pregnancy: If you are pregnant, call and cancel the procedure. Sickness: If you have a cold, fever, or any active infections, call and cancel the procedure. Arrival: You must be in the facility at least 30 minutes prior to your scheduled procedure. Children: Do not bring children with you. Dress appropriately: Bring dark clothing that you would not mind if they get stained. Valuables: Do not bring any jewelry or valuables.  Reasons to call and reschedule or cancel your procedure: (Following these recommendations will minimize the risk of a serious complication.) Surgeries: Avoid having procedures within 2 weeks of any surgery. (Avoid for 2 weeks before or after any surgery). Flu Shots: Avoid having procedures within 2 weeks of a flu shots. (Avoid for 2 weeks before or after immunizations). Barium: Avoid having a procedure within 7-10 days after having had a radiological study involving the use of radiological contrast. (Myelograms, Barium swallow or enema study). Heart attacks: Avoid any elective procedures or surgeries for the initial 6 months after a "Myocardial Infarction" (Heart  Attack). Blood thinners: It is imperative that  you stop these medications before procedures. Let us know if you if you take any blood thinner.  Infection: Avoid procedures during or within two weeks of an infection (including chest colds or gastrointestinal problems). Symptoms associated with infections include: Localized redness, fever, chills, night sweats or profuse sweating, burning sensation when voiding, cough, congestion, stuffiness, runny nose, sore throat, diarrhea, nausea, vomiting, cold or Flu symptoms, recent or current infections. It is specially important if the infection is over the area that we intend to treat. Heart and lung problems: Symptoms that may suggest an active cardiopulmonary problem include: cough, chest pain, breathing difficulties or shortness of breath, dizziness, ankle swelling, uncontrolled high or unusually low blood pressure, and/or palpitations. If you are experiencing any of these symptoms, cancel your procedure and contact your primary care physician for an evaluation.  Remember:  Regular Business hours are:  Monday to Thursday 8:00 AM to 4:00 PM  Provider's Schedule: Milinda Pointer, MD:  Procedure days: Tuesday and Thursday 7:30 AM to 4:00 PM  Gillis Santa, MD:  Procedure days: Monday and Wednesday 7:30 AM to 4:00 PM ______________________________________________________________________  Epidural Steroid Injection Patient Information  Description: The epidural space surrounds the nerves as they exit the spinal cord.  In some patients, the nerves can be compressed and inflamed by a bulging disc or a tight spinal canal (spinal stenosis).  By injecting steroids into the epidural space, we can bring irritated nerves into direct contact with a potentially helpful medication.  These steroids act directly on the irritated nerves and can reduce swelling and inflammation which often leads to decreased pain.  Epidural steroids may be injected anywhere along the spine and from the neck to the low back depending  upon the location of your pain.   After numbing the skin with local anesthetic (like Novocaine), a small needle is passed into the epidural space slowly.  You may experience a sensation of pressure while this is being done.  The entire block usually last less than 10 minutes.  Conditions which may be treated by epidural steroids:  Low back and leg pain Neck and arm pain Spinal stenosis Post-laminectomy syndrome Herpes zoster (shingles) pain Pain from compression fractures  Preparation for the injection:  Do not eat any solid food or dairy products within 8 hours of your appointment.  You may drink clear liquids up to 3 hours before appointment.  Clear liquids include water, black coffee, juice or soda.  No milk or cream please. You may take your regular medication, including pain medications, with a sip of water before your appointment  Diabetics should hold regular insulin (if taken separately) and take 1/2 normal NPH dos the morning of the procedure.  Carry some sugar containing items with you to your appointment. A driver must accompany you and be prepared to drive you home after your procedure.  Bring all your current medications with your. An IV may be inserted and sedation may be given at the discretion of the physician.   A blood pressure cuff, EKG and other monitors will often be applied during the procedure.  Some patients may need to have extra oxygen administered for a short period. You will be asked to provide medical information, including your allergies, prior to the procedure.  We must know immediately if you are taking blood thinners (like Coumadin/Warfarin)  Or if you are allergic to IV iodine contrast (dye). We must know if you could possible be pregnant.  Possible  side-effects: Bleeding from needle site Infection (rare, may require surgery) Nerve injury (rare) Numbness & tingling (temporary) Difficulty urinating (rare, temporary) Spinal headache ( a headache worse with  upright posture) Light -headedness (temporary) Pain at injection site (several days) Decreased blood pressure (temporary) Weakness in arm/leg (temporary) Pressure sensation in back/neck (temporary)  Call if you experience: Fever/chills associated with headache or increased back/neck pain. Headache worsened by an upright position. New onset weakness or numbness of an extremity below the injection site Hives or difficulty breathing (go to the emergency room) Inflammation or drainage at the infection site Severe back/neck pain Any new symptoms which are concerning to you  Please note:  Although the local anesthetic injected can often make your back or neck feel good for several hours after the injection, the pain will likely return.  It takes 3-7 days for steroids to work in the epidural space.  You may not notice any pain relief for at least that one week.  If effective, we will often do a series of three injections spaced 3-6 weeks apart to maximally decrease your pain.  After the initial series, we generally will wait several months before considering a repeat injection of the same type.  If you have any questions, please call [phone] [facility]

## 2021-05-09 ENCOUNTER — Encounter: Payer: Self-pay | Admitting: Student in an Organized Health Care Education/Training Program

## 2021-05-09 ENCOUNTER — Ambulatory Visit (HOSPITAL_BASED_OUTPATIENT_CLINIC_OR_DEPARTMENT_OTHER)
Payer: No Typology Code available for payment source | Admitting: Student in an Organized Health Care Education/Training Program

## 2021-05-09 ENCOUNTER — Ambulatory Visit
Admission: RE | Admit: 2021-05-09 | Discharge: 2021-05-09 | Disposition: A | Discharge status: Home / Self Care | Payer: No Typology Code available for payment source | Source: Ambulatory Visit | Attending: Student in an Organized Health Care Education/Training Program | Admitting: Student in an Organized Health Care Education/Training Program

## 2021-05-09 ENCOUNTER — Other Ambulatory Visit: Payer: Self-pay

## 2021-05-09 VITALS — BP 141/96 | HR 84 | Temp 98.2°F | Resp 22 | Ht 60.0 in | Wt 186.0 lb

## 2021-05-09 DIAGNOSIS — M51379 Other intervertebral disc degeneration, lumbosacral region without mention of lumbar back pain or lower extremity pain: Secondary | ICD-10-CM

## 2021-05-09 DIAGNOSIS — M5137 Other intervertebral disc degeneration, lumbosacral region: Secondary | ICD-10-CM | POA: Diagnosis present

## 2021-05-09 DIAGNOSIS — G894 Chronic pain syndrome: Secondary | ICD-10-CM

## 2021-05-09 DIAGNOSIS — M4724 Other spondylosis with radiculopathy, thoracic region: Secondary | ICD-10-CM | POA: Diagnosis not present

## 2021-05-09 DIAGNOSIS — M5124 Other intervertebral disc displacement, thoracic region: Secondary | ICD-10-CM

## 2021-05-09 MED ORDER — SODIUM CHLORIDE 0.9% FLUSH
2.0000 mL | Freq: Once | INTRAVENOUS | Status: AC
  Administered 2021-05-09: 2 mL

## 2021-05-09 MED ORDER — DEXAMETHASONE SODIUM PHOSPHATE 10 MG/ML IJ SOLN
10.0000 mg | Freq: Once | INTRAMUSCULAR | Status: AC
  Administered 2021-05-09: 10 mg
  Filled 2021-05-09: qty 1

## 2021-05-09 MED ORDER — SODIUM CHLORIDE (PF) 0.9 % IJ SOLN
INTRAMUSCULAR | Status: AC
  Filled 2021-05-09: qty 10

## 2021-05-09 MED ORDER — LIDOCAINE HCL 2 % IJ SOLN
20.0000 mL | Freq: Once | INTRAMUSCULAR | Status: AC
  Administered 2021-05-09: 400 mg
  Filled 2021-05-09: qty 20

## 2021-05-09 MED ORDER — ROPIVACAINE HCL 2 MG/ML IJ SOLN
2.0000 mL | Freq: Once | INTRAMUSCULAR | Status: AC
  Administered 2021-05-09: 2 mL via EPIDURAL

## 2021-05-09 MED ORDER — ROPIVACAINE HCL 2 MG/ML IJ SOLN
INTRAMUSCULAR | Status: AC
  Filled 2021-05-09: qty 20

## 2021-05-09 NOTE — Patient Instructions (Signed)

## 2021-05-09 NOTE — Progress Notes (Signed)
PROVIDER NOTE: Interpretation of information contained herein should be left to medically-trained personnel. Specific patient instructions are provided elsewhere under "Patient Instructions" section of medical record. This document was created in part using STT-dictation technology, any transcriptional errors that may result from this process are unintentional.  Patient: Mallory Meyers Type: Established DOB: 1970-05-21 MRN: [id number] PCP: Jearld Fenton, NP  Service: Procedure DOS: 05/09/2021 Setting: Ambulatory Location: Ambulatory outpatient facility Delivery: Face-to-face Provider: Gillis Santa, MD Specialty: Interventional Pain Management Specialty designation: 09 Location: Outpatient facility Ref. Prov.: Jearld Fenton, NP    Primary Reason for Visit: Interventional Pain Management Treatment. CC: Back Pain (Mid and lower)  Procedure:           Inter-Laminar Thoracic Epidural Steroid Block/Injection  #1  Laterality:  Midline Level: T12-L1  Imaging: Fluoroscopic guidance Anesthesia: Local anesthesia (1-2% Lidocaine) Anxiolysis: None Sedation: None.  DOS: 05/09/2021 Performed by: Gillis Santa, MD  Purpose: Diagnostic/Therapeutic Indications: Thoracic back pain, radicular pain, with degenerative disc disease severe enough to impact quality of life or function. 1. 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 Thoracic disc herniation   3. DDD (degenerative disc disease), lumbosacral   4. Chronic pain syndrome    NAS-11 Pain score:   Pre-procedure: 6 /10   Post-procedure: 5 /10     Position / Prep / Materials:  Position: Prone  Prep solution: DuraPrep (Iodine Povacrylex [0.7% available iodine] and Isopropyl Alcohol, 74% w/w) Prep Area: Posterior Thoracolumbar (Upper back from shoulders to lower lumbar region).  Materials:  Tray: Epidural Needle(s) Type: Epidural needle Gauge: 22G Length: 3.5-in Qty: 1 Pre-op H&P Assessment:  Mallory Meyers. Mallory Meyers is  a 51 y.o. (year old), female patient, seen today for interventional treatment. She  has a past surgical history that includes Knee surgery (Left); Toe arthroplasty (Right, 12/23/2014); Cholecystectomy (1997); and Colonoscopy with propofol (N/A, 09/25/2016). Mallory Meyers. Mallory Meyers has a current medication list which includes the following prescription(s): bupropion, cyclobenzaprine, gabapentin, hydroxyzine, letrozole, metoprolol succinate, multiple vitamins-minerals, prednisone, sertraline, topiramate, tramadol, triamterene-hydrochlorothiazide, zolmitriptan, and zolpidem. Her primarily concern today is the Back Pain (Mid and lower)  Initial Vital Signs:  Pulse/HCG Rate: 84ECG Heart Rate: 78 Temp: 98.2 F (36.8 C) Resp: 18 BP: 111/86 SpO2: 100 %  BMI: Estimated body mass index is 36.33 kg/m as calculated from the following:   Height as of this encounter: 5' (1.524 m).   Weight as of this encounter: 186 lb (84.4 kg).  Risk Assessment: Allergies: Reviewed. She has No Known Allergies.  Allergy Precautions: None required Coagulopathies: Reviewed. None identified.  Blood-thinner therapy: None at this time Active Infection(s): Reviewed. None identified. Mallory Meyers. Mallory Meyers is afebrile  Site Confirmation: Mallory Meyers. Mallory Meyers was asked to confirm the procedure and laterality before marking the site Procedure checklist: Completed Consent: Before the procedure and under the influence of no sedative(s), amnesic(s), or anxiolytics, the patient was informed of the treatment options, risks and possible complications. To fulfill our ethical and legal obligations, as recommended by the American Medical Association's Code of Ethics, I have informed the patient of my clinical impression; the nature and purpose of the treatment or procedure; the risks, benefits, and possible complications of the intervention; the alternatives, including doing nothing; the risk(s) and benefit(s) of the alternative treatment(s) or procedure(s); and the risk(s)  and benefit(s) of doing nothing. The patient was provided information about the general risks and possible complications associated with the procedure. These may include, but are not limited to: failure to achieve desired goals, infection,  bleeding, organ or nerve damage, allergic reactions, paralysis, and death. In addition, the patient was informed of those risks and complications associated to Spine-related procedures, such as failure to decrease pain; infection (i.e.: Meningitis, epidural or intraspinal abscess); bleeding (i.e.: epidural hematoma, subarachnoid hemorrhage, or any other type of intraspinal or peri-dural bleeding); organ or nerve damage (i.e.: Any type of peripheral nerve, nerve root, or spinal cord injury) with subsequent damage to sensory, motor, and/or autonomic systems, resulting in permanent pain, numbness, and/or weakness of one or several areas of the body; allergic reactions; (i.e.: anaphylactic reaction); and/or death. Furthermore, the patient was informed of those risks and complications associated with the medications. These include, but are not limited to: allergic reactions (i.e.: anaphylactic or anaphylactoid reaction(s)); adrenal axis suppression; blood sugar elevation that in diabetics may result in ketoacidosis or comma; water retention that in patients with history of congestive heart failure may result in shortness of breath, pulmonary edema, and decompensation with resultant heart failure; weight gain; swelling or edema; medication-induced neural toxicity; particulate matter embolism and blood vessel occlusion with resultant organ, and/or nervous system infarction; and/or aseptic necrosis of one or more joints. Finally, the patient was informed that Medicine is not an exact science; therefore, there is also the possibility of unforeseen or unpredictable risks and/or possible complications that may result in a catastrophic outcome. The patient indicated having understood very  clearly. We have given the patient no guarantees and we have made no promises. Enough time was given to the patient to ask questions, all of which were answered to the patient's satisfaction. Mallory Meyers. Cho has indicated that she wanted to continue with the procedure. Attestation: I, the ordering provider, attest that I have discussed with the patient the benefits, risks, side-effects, alternatives, likelihood of achieving goals, and potential problems during recovery for the procedure that I have provided informed consent. Date   Time: 05/09/2021  1:14 PM  Pre-Procedure Preparation:  Monitoring: As per clinic protocol. Respiration, ETCO2, SpO2, BP, heart rate and rhythm monitor placed and checked for adequate function Safety Precautions: Patient was assessed for positional comfort and pressure points before starting the procedure. Time-out: I initiated and conducted the "Time-out" before starting the procedure, as per protocol. The patient was asked to participate by confirming the accuracy of the "Time Out" information. Verification of the correct person, site, and procedure were performed and confirmed by me, the nursing staff, and the patient. "Time-out" conducted as per Joint Commission's Universal Protocol (UP.01.01.01). Time: 1350  Description of Procedure:          Target Area: For Epidural Steroid injection(s), the target area is the  interlaminar space, initially targeting the lower border of the superior vertebral body lamina. Approach: Interlaminar approach. Area Prepped: Entire Posterior Thoracolumbar Region DuraPrep (Iodine Povacrylex [0.7% available iodine] and Isopropyl Alcohol, 74% w/w) Safety Precautions: Aspiration looking for blood return was conducted prior to all injections. At no point did we inject any substances, as a needle was being advanced. No attempts were made at seeking any paresthesias. Safe injection practices and needle disposal techniques used. Medications properly  checked for expiration dates. SDV (single dose vial) medications used. Description of the Procedure: Protocol guidelines were followed. The patient was placed in position over the fluoroscopy table. The target area was identified and the area prepped in the usual manner. Skin & deeper tissues infiltrated with local anesthetic. Appropriate amount of time allowed to pass for local anesthetics to take effect. The procedure needles were then advanced to the  target area. The inferior aspect of the superior lamina was contacted and the needle walked caudad, until the lamina was cleared. The epidural space was identified using "loss-of-resistance technique" with 0.9% PF-NSS (2-50mL), in a low friction 10cc LOR glass syringe. Proper needle placement was secured. Negative aspiration confirmed. Solution injected in intermittent fashion, asking for systemic symptoms every 0.5 cc of injectate. The needles were then removed and the area cleansed, making sure to leave some of the prepping solution behind to take advantage of its long term bactericidal properties.  5 cc solution made of 2 cc of preservative-free saline, 2 cc of 0.2% ropivacaine, 1 cc of Decadron 10 mg/cc.   Vitals:   05/09/21 1343 05/09/21 1348 05/09/21 1351 05/09/21 1356  BP: (!) 155/97 (!) 149/96 (!) 155/94 (!) 141/96  Pulse:      Resp: (!) 23 19 19  (!) 22  Temp:      TempSrc:      SpO2: 100% 98% 100% 99%  Weight:      Height:        Start Time: 1350 hrs. End Time: 1355 hrs. Imaging Guidance (Spinal):          Type of Imaging Technique: Fluoroscopy Guidance (Spinal) Indication(s): Assistance in needle guidance and placement for procedures requiring needle placement in or near specific anatomical locations not easily accessible without such assistance. Exposure Time: Please see nurses notes. Contrast: Before injecting any contrast, we confirmed that the patient did not have an allergy to iodine, shellfish, or radiological contrast. Once  satisfactory needle placement was completed at the desired level, radiological contrast was injected. Contrast injected under live fluoroscopy. No contrast complications. See chart for type and volume of contrast used. Fluoroscopic Guidance: I was personally present during the use of fluoroscopy. "Tunnel Vision Technique" used to obtain the best possible view of the target area. Parallax error corrected before commencing the procedure. "Direction-depth-direction" technique used to introduce the needle under continuous pulsed fluoroscopy. Once target was reached, antero-posterior, oblique, and lateral fluoroscopic projection used confirm needle placement in all planes. Images permanently stored in EMR. Interpretation: I personally interpreted the imaging intraoperatively. Adequate needle placement confirmed in multiple planes. Appropriate spread of contrast into desired area was observed. No evidence of afferent or efferent intravascular uptake. No intrathecal or subarachnoid spread observed. Permanent images saved into the patient's record.   Post-operative Assessment:  Post-procedure Vital Signs:  Pulse/HCG Rate: 8474 Temp: 98.2 F (36.8 C) Resp: (!) 22 BP: (!) 141/96 SpO2: 99 %  EBL: None  Complications: No immediate post-treatment complications observed by team, or reported by patient.  Note: The patient tolerated the entire procedure well. A repeat set of vitals were taken after the procedure and the patient was kept under observation following institutional policy, for this type of procedure. Post-procedural neurological assessment was performed, showing return to baseline, prior to discharge. The patient was provided with post-procedure discharge instructions, including a section on how to identify potential problems. Should any problems arise concerning this procedure, the patient was given instructions to immediately contact us, at any time, without hesitation. In any case, we plan to  contact the patient by telephone for a follow-up status report regarding this interventional procedure.  Comments:  No additional relevant information.  Plan of Care  Orders:  Orders Placed This Encounter  Procedures   DG PAIN CLINIC C-ARM 1-60 MIN NO REPORT    Intraoperative interpretation by procedural physician at [place].    Standing Status:   Standing  Number of Occurrences:   1    Order Specific Question:   Reason for exam:    Answer:   Assistance in needle guidance and placement for procedures requiring needle placement in or near specific anatomical locations not easily accessible without such assistance.    Medications ordered for procedure: Meds ordered this encounter  Medications   lidocaine (XYLOCAINE) 2 % (with pres) injection 400 mg   ropivacaine (PF) 2 mg/mL (0.2%) (NAROPIN) injection 2 mL   sodium chloride flush (NS) 0.9 % injection 2 mL   dexamethasone (DECADRON) injection 10 mg   Medications administered: We administered lidocaine, ropivacaine (PF) 2 mg/mL (0.2%), sodium chloride flush, and dexamethasone.  See the medical record for exact dosing, route, and time of administration.  Follow-up plan:   Return in about 5 weeks (around 06/13/2021) for Post Procedure Evaluation, virtual.      Recent Visits Date Type Provider Dept  05/02/21 Office Visit Gillis Santa, MD [facility]  Showing recent visits within past 90 days and meeting all other requirements Today's Visits Date Type Provider Dept  05/09/21 Procedure visit Gillis Santa, MD [facility]  Showing today's visits and meeting all other requirements Future Appointments Date Type Provider Dept  06/13/21 Appointment Gillis Santa, MD [facility]  Showing future appointments within next 90 days and meeting all other requirements  Disposition: Discharge home  Discharge (Date   Time): 05/09/2021; 1402 hrs.   Primary Care Physician: Jearld Fenton,  NP Location: [facility] Note by: Gillis Santa, MD Date: 05/09/2021; Time: 2:08 PM  Disclaimer:  Medicine is not an exact science. The only guarantee in medicine is that nothing is guaranteed. It is important to note that the decision to proceed with this intervention was based on the information collected from the patient. The Data and conclusions were drawn from the patient's questionnaire, the interview, and the physical examination. Because the information was provided in large part by the patient, it cannot be guaranteed that it has not been purposely or unconsciously manipulated. Every effort has been made to obtain as much relevant data as possible for this evaluation. It is important to note that the conclusions that lead to this procedure are derived in large part from the available data. Always take into account that the treatment will also be dependent on availability of resources and existing treatment guidelines, considered by other Pain Management Practitioners as being common knowledge and practice, at the time of the intervention. For Medico-Legal purposes, it is also important to point out that variation in procedural techniques and pharmacological choices are the acceptable norm. The indications, contraindications, technique, and results of the above procedure should only be interpreted and judged by a Board-Certified Interventional Pain Specialist with extensive familiarity and expertise in the same exact procedure and technique.

## 2021-05-09 NOTE — Progress Notes (Signed)
Safety precautions to be maintained throughout the outpatient stay will include: orient to surroundings, keep bed in low position, maintain call bell within reach at all times, provide assistance with transfer out of bed and ambulation.  

## 2021-05-10 ENCOUNTER — Telehealth: Payer: Self-pay | Admitting: *Deleted

## 2021-05-10 NOTE — Telephone Encounter (Signed)
Called PP. States she is doing wonderful. Instructed to call if needed.

## 2021-06-12 ENCOUNTER — Encounter: Payer: Self-pay | Admitting: Student in an Organized Health Care Education/Training Program

## 2021-06-13 ENCOUNTER — Other Ambulatory Visit: Payer: Self-pay

## 2021-06-13 ENCOUNTER — Ambulatory Visit
Payer: No Typology Code available for payment source | Attending: Student in an Organized Health Care Education/Training Program | Admitting: Student in an Organized Health Care Education/Training Program

## 2021-06-13 DIAGNOSIS — M4724 Other spondylosis with radiculopathy, thoracic region: Secondary | ICD-10-CM | POA: Diagnosis not present

## 2021-06-13 DIAGNOSIS — M5137 Other intervertebral disc degeneration, lumbosacral region: Secondary | ICD-10-CM | POA: Diagnosis not present

## 2021-06-13 DIAGNOSIS — G894 Chronic pain syndrome: Secondary | ICD-10-CM

## 2021-06-13 DIAGNOSIS — Z853 Personal history of malignant neoplasm of breast: Secondary | ICD-10-CM | POA: Diagnosis not present

## 2021-06-13 DIAGNOSIS — M5124 Other intervertebral disc displacement, thoracic region: Secondary | ICD-10-CM | POA: Diagnosis not present

## 2021-06-13 NOTE — Progress Notes (Signed)
Patient: Mallory Meyers  Service Category: E/M  Provider: Gillis Santa, MD  DOB: [birth year]  DOS: 06/13/2021  Location: Office  MRN: [id number]  Setting: Ambulatory outpatient  Referring Provider: Jearld Fenton, NP  Type: Established Patient  Specialty: Interventional Pain Management  PCP: Jearld Fenton, NP  Location: Remote location  Delivery: TeleHealth     Virtual Encounter - Pain Management PROVIDER NOTE: Information contained herein reflects review and annotations entered in association with encounter. Interpretation of such information and data should be left to medically-trained personnel. Information provided to patient can be located elsewhere in the medical record under "Patient Instructions". Document created using STT-dictation technology, any transcriptional errors that may result from process are unintentional.    Contact & Pharmacy Preferred: [phone] Home: [phone] (home) Mobile: [phone] (mobile) E-mail: [email or web address]  [id number] [place] [id number]: [phone]: [phone]  Pre-screening  Mallory Meyers offered "in-person" vs "virtual" encounter. She indicated preferring virtual for this encounter.   Reason COVID-19*   Social distancing based on CDC and AMA recommendations.   I contacted Mallory Meyers on 06/13/2021 via telephone.      I clearly identified myself as BGillis Santa MD. I verified that I was speaking with the correct person using two identifiers (Name: Mallory Meyers and date of birth: 615-Jul-1972.  Consent I sought verbal advanced consent from Mallory Rasefor virtual visit interactions. I informed Mallory Meyers of possible security and privacy concerns, risks, and limitations associated with providing "not-in-person" medical evaluation and management services. I also informed Mallory Meyers of the availability of "in-person" appointments. Finally, I informed her that there  would be a charge for the virtual visit and that she could be  personally, fully or partially, financially responsible for it. [name] understanding and agreed to proceed.   Historic Elements   [name] a 51y.o. year old, female patient evaluated today after our last contact on 05/09/2021. Mallory Meyers has a past medical history of Anemia, Breast cancer ([place] (04/2018), Depression, Headache, Hypertension, Polycystic ovaries, and Seizures ([place]. She also  has a past surgical history that includes Knee surgery (Left); Toe arthroplasty (Right, 12/23/2014); Cholecystectomy (1997); and Colonoscopy with propofol (N/A, 09/25/2016). Ms. CVanderhoefhas a current medication list which includes the following prescription(s): bupropion, cyclobenzaprine, hydroxyzine, letrozole, metoprolol succinate, multiple vitamins-minerals, prednisone, sertraline, topiramate, tramadol, triamterene-hydrochlorothiazide, zolmitriptan, zolpidem, and gabapentin. She  reports that she has never smoked. She has never used smokeless tobacco. She reports current alcohol use. She reports that she does not use drugs. Ms. CSzuchhas No Known Allergies.   HPI  Today, she is being contacted for a post-procedure assessment.   Post-procedure evaluation    Inter-Laminar Thoracic Epidural Steroid Block/Injection  #1  Laterality:  Midline Level: T12-L1  Imaging: Fluoroscopic guidance Anesthesia: Local anesthesia (1-2% Lidocaine) Anxiolysis: None Sedation: None.  DOS: 05/09/2021 Performed by: BGillis Santa MD  Purpose: Diagnostic/Therapeutic Indications: Thoracic back pain, radicular pain, with degenerative disc disease severe enough to impact quality of life or function. 1. 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 Thoracic disc herniation   3. DDD (degenerative disc disease), lumbosacral   4. Chronic pain syndrome    NAS-11 Pain score:   Pre-procedure: 6 /10   Post-procedure: 5  /10      Effectiveness:  Initial hour after procedure: 100 %  Subsequent 4-6 hours post-procedure:  100 %  Analgesia past initial 6 hours: 90% for the first 2 weeks then decreased to 60 %  Ongoing improvement:  Analgesic:  50% however pain is increasing and patient would like to repeat Function: Somewhat improved ROM: Mallory Meyers reports improvement in ROM   Laboratory Chemistry Profile   Renal Lab Results  Component Value Date   BUN 21 10/24/2020   CREATININE 1.03 12/82/0813   BCR NOT APPLICABLE 88/71/9597   GFR 62.43 04/14/2019   GFRAA 73 10/24/2020   GFRNONAA 63 10/24/2020    Hepatic Lab Results  Component Value Date   AST 18 10/24/2020   ALT 17 10/24/2020   ALBUMIN 3.9 04/14/2019   ALKPHOS 58 04/14/2019   LIPASE 63 (L) 08/15/2012    Electrolytes Lab Results  Component Value Date   NA 138 10/24/2020   K 4.0 10/24/2020   CL 102 10/24/2020   CALCIUM 10.0 10/24/2020    Bone Lab Results  Component Value Date   VD25OH 21.91 (L) 03/02/2019    Inflammation (CRP: Acute Phase) (ESR: Chronic Phase) No results found for: CRP, ESRSEDRATE, LATICACIDVEN       Note: Above Lab results reviewed.    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 Diagnoses of Thoracic disc herniation, DDD (degenerative disc disease), lumbosacral, History of breast cancer, and Chronic pain syndrome were also pertinent to this visit.  Plan of Care   Good relief after T12-L1 epidural steroid injection.  Patient states that her pain relief was better than what she has previously experienced after her prior epidurals with [facility].  She is wondering when she can repeat this.  She is not a diabetic.  Recommend limiting her spinal steroid exposure to no more than 4 injections/year.  Can consider repeating in 6 weeks.  As needed order placed below.   Orders:  Orders Placed This Encounter  Procedures   Thoracic  Epidural Injection    Standing Status:   Standing    Number of Occurrences:   3    Standing Expiration Date:   12/11/2021    Scheduling Instructions:     T12/L1     PRN-patient will call    Order Specific Question:   Where will this procedure be performed?    Answer:   [facility]   Follow-up plan:   Return for Repeat T12-L1 ESI as needed, as needed order placed for patient to call and request.    Recent Visits Date Type Provider Dept  05/09/21 Procedure visit Mallory Santa, MD [facility]  05/02/21 Office Visit Mallory Santa, MD [facility]  Showing recent visits within past 90 days and meeting all other requirements Today's Visits Date Type Provider Dept  06/13/21 Office Visit Mallory Santa, MD [facility]  Showing today's visits and meeting all other requirements Future Appointments No visits were found meeting these conditions. Showing future appointments within next 90 days and meeting all other requirements  I discussed the assessment and treatment plan with the patient. The patient was provided an opportunity to ask questions and all were answered. The patient agreed with the plan and demonstrated an understanding of the instructions.  Patient advised to call back or seek an in-person evaluation if the symptoms or condition worsens.  Duration of encounter: 74mnutes.  Note by: BGillis Santa MD Date: 06/13/2021; Time: 2:09 PM

## 2021-06-21 ENCOUNTER — Encounter: Payer: Self-pay | Admitting: Internal Medicine

## 2021-06-21 ENCOUNTER — Ambulatory Visit (INDEPENDENT_AMBULATORY_CARE_PROVIDER_SITE_OTHER): Payer: No Typology Code available for payment source | Admitting: Internal Medicine

## 2021-06-21 ENCOUNTER — Other Ambulatory Visit: Payer: Self-pay

## 2021-06-21 DIAGNOSIS — E6609 Other obesity due to excess calories: Secondary | ICD-10-CM | POA: Insufficient documentation

## 2021-06-21 DIAGNOSIS — Z6837 Body mass index (BMI) 37.0-37.9, adult: Secondary | ICD-10-CM

## 2021-06-21 DIAGNOSIS — N952 Postmenopausal atrophic vaginitis: Secondary | ICD-10-CM | POA: Diagnosis not present

## 2021-06-21 MED ORDER — ESTRADIOL 0.1 MG/GM VA CREA: TOPICAL_CREAM | VAGINAL | 2 refills | Status: AC

## 2021-06-21 NOTE — Assessment & Plan Note (Signed)
Encourage diet and exercise for weight loss 

## 2021-06-21 NOTE — Assessment & Plan Note (Signed)
We will start Estrace cream Advised her to use a water-based lubricant with intercourse

## 2021-06-21 NOTE — Progress Notes (Signed)
Subjective:    Patient ID: Mallory Meyers, female    DOB: [birth year], 51 y.o.   MRN: [id number]  HPI  Patient presents to clinic today with complaint of vaginal discomfort.  She reports vaginal dryness and painful intercourse.  She noticed this when she started going through menopause which was related to chemotherapy.  She denies other menopausal symptoms such as mood swings, hot flashes, night sweats and irritability.  She has not tried anything OTC for this.  Review of Systems     Past Medical History:  Diagnosis Date   Anemia    Breast cancer ([place]) 04/2018   RIGHT   Depression    Headache    Hypertension    Polycystic ovaries    Seizures ([facility])    AS A CHILD    Current Outpatient Medications  Medication Sig Dispense Refill   buPROPion (WELLBUTRIN XL) 300 MG 24 hr tablet TAKE 1 TABLET BY MOUTH EVERY DAY 90 tablet 2   cyclobenzaprine (FLEXERIL) 10 MG tablet Take 1 tablet (10 mg total) by mouth daily as needed for muscle spasms. 30 tablet 0   gabapentin (NEURONTIN) 300 MG capsule Take 1 capsule (300 mg total) by mouth 3 (three) times daily. 90 capsule 11   hydrOXYzine (ATARAX/VISTARIL) 10 MG tablet TAKE 1 TABLET BY MOUTH EVERY DAY AS NEEDED 90 tablet 1   letrozole (FEMARA) 2.5 MG tablet Take 2.5 mg by mouth daily.     metoprolol succinate (TOPROL-XL) 100 MG 24 hr tablet Take 1 tablet (100 mg total) by mouth daily. MUST SCHEDULE ANNUAL EXAM FOR REFILLS 90 tablet 3   Multiple Vitamins-Minerals (ONE-A-DAY WOMENS 50 PLUS PO) Take 1 tablet by mouth daily at 12 noon.     predniSONE (DELTASONE) 10 MG tablet Take 6 tabs on day 1, 5 tabs on day 2, 4 tabs on day 3, 3 tabs on day 4, 2 tabs on day 5, 1 tab on day 6 21 tablet 0   sertraline (ZOLOFT) 100 MG tablet Take 1 tablet (100 mg total) by mouth daily. 90 tablet 3   topiramate (TOPAMAX) 25 MG tablet TAKE 4 TABLETS BY MOUTH DAILY. 360 tablet 0   traMADol (ULTRAM) 50 MG tablet Take 2 tablets (100 mg total) by mouth daily as needed. 60  tablet 0   triamterene-hydrochlorothiazide (MAXZIDE-25) 37.5-25 MG tablet Take 1 tablet by mouth daily. 90 tablet 0   zolmitriptan (ZOMIG) 5 MG tablet TAKE 1 TABLET (5 MG TOTAL) BY MOUTH ONCE AS NEEDED. MAY REPEAT IN TWO HOURS AS NEEDED 6 tablet 11   zolpidem (AMBIEN) 5 MG tablet Take 1 tablet (5 mg total) by mouth at bedtime as needed for sleep. 30 tablet 0   No current facility-administered medications for this visit.    No Known Allergies  Family History  Problem Relation Age of Onset   Lung disease Mother    Hypertension Mother    Kidney disease Mother    Diabetes Mother    Colon polyps Mother        colon resection   Hypertension Maternal Grandmother    Breast cancer Neg Hx    Colon cancer Neg Hx     Social History   Socioeconomic History   Marital status: Divorced    Spouse name: Not on file   Number of children: Not on file   Years of education: Not on file   Highest education level: Not on file  Occupational History   Not on file  Tobacco Use  Smoking status: Never   Smokeless tobacco: Never  Vaping Use   Vaping Use: Never used  Substance and Sexual Activity   Alcohol use: Yes    Alcohol/week: 0.0 standard drinks    Comment: occasional   Drug use: No   Sexual activity: Yes    Birth control/protection: None  Other Topics Concern   Not on file  Social History Narrative   Not on file   Social Determinants of Health   Financial Resource Strain: Not on file  Food Insecurity: Not on file  Transportation Needs: Not on file  Physical Activity: Not on file  Stress: Not on file  Social Connections: Not on file  Intimate Partner Violence: Not on file     Constitutional: Denies fever, malaise, fatigue, headache or abrupt weight changes.  Respiratory: Denies difficulty breathing, shortness of breath, cough or sputum production.   Cardiovascular: Denies chest pain, chest tightness, palpitations or swelling in the hands or feet.  Gastrointestinal: Denies  abdominal pain, bloating, constipation, diarrhea or blood in the stool.  GU: Pt reports vaginal discomfort. Denies urgency, frequency, pain with urination, burning sensation, blood in urine, odor or discharge. Skin: Denies redness, rashes, lesions or ulcercations.   No other specific complaints in a complete review of systems (except as listed in HPI above).  Objective:   Physical Exam  BP 116/75 (BP Location: Right Arm, Patient Position: Sitting, Cuff Size: Large)    Pulse 87    Temp (!) 97.1 F (36.2 C) (Temporal)    Wt 194 lb (88 kg)    LMP 05/19/2018 (Exact Date)    SpO2 100%    BMI 37.89 kg/m   Wt Readings from Last 3 Encounters:  05/09/21 186 lb (84.4 kg)  05/02/21 186 lb (84.4 kg)  03/12/21 190 lb 9.6 oz (86.5 kg)    General: Appears her stated age, obese, in NAD. Cardiovascular: Normal rat. Pulmonary/Chest: Normal effort. Musculoskeletal: No difficulty with gait.  Neurological: Alert and oriented.  BMET    Component Value Date/Time   NA 138 10/24/2020 0840   NA 136 06/16/2015 1442   NA 137 08/15/2012 0949   K 4.0 10/24/2020 0840   K 3.8 08/15/2012 0949   CL 102 10/24/2020 0840   CL 104 08/15/2012 0949   CO2 30 10/24/2020 0840   CO2 26 08/15/2012 0949   GLUCOSE 95 10/24/2020 0840   GLUCOSE 92 08/15/2012 0949   BUN 21 10/24/2020 0840   BUN 15 06/16/2015 1442   BUN 16 08/15/2012 0949   CREATININE 1.03 10/24/2020 0840   CALCIUM 10.0 10/24/2020 0840   CALCIUM 9.0 08/15/2012 0949   GFRNONAA 63 10/24/2020 0840   GFRAA 73 10/24/2020 0840    Lipid Panel     Component Value Date/Time   CHOL 214 (H) 10/24/2020 0840   TRIG 142 10/24/2020 0840   HDL 50 10/24/2020 0840   CHOLHDL 4.3 10/24/2020 0840   VLDL 26.8 04/14/2019 1517   LDLCALC 137 (H) 10/24/2020 0840    CBC    Component Value Date/Time   WBC 5.3 10/24/2020 0840   RBC 5.18 (H) 10/24/2020 0840   HGB 13.7 10/24/2020 0840   HGB 13.6 06/16/2015 1442   HCT 43.3 10/24/2020 0840   HCT 40.4 06/16/2015  1442   PLT 282 10/24/2020 0840   PLT 302 06/16/2015 1442   MCV 83.6 10/24/2020 0840   MCV 82 06/16/2015 1442   MCV 82 08/15/2012 0949   MCH 26.4 (L) 10/24/2020 0840   MCHC 31.6 (  L) 10/24/2020 0840   RDW 13.4 10/24/2020 0840   RDW 13.0 06/16/2015 1442   RDW 12.7 08/15/2012 0949   LYMPHSABS 2.0 12/22/2014 1052   MONOABS 0.4 12/22/2014 1052   EOSABS 0.2 12/22/2014 1052   BASOSABS 0.1 12/22/2014 1052    Hgb A1C Lab Results  Component Value Date   HGBA1C 5.6 10/24/2020          Assessment & Plan:    Webb Silversmith, NP This visit occurred during the SARS-CoV-2 public health emergency.  Safety protocols were in place, including screening questions prior to the visit, additional usage of staff PPE, and extensive cleaning of exam room while observing appropriate contact time as indicated for disinfecting solutions.

## 2021-06-21 NOTE — Patient Instructions (Signed)
Atrophic Vaginitis Atrophic vaginitis is when the lining of the vagina becomes dry and thin. This is most common in women who have stopped having their periods (are in menopause). It usually starts when a woman is 31 to 52 years old. What are the causes? This condition is caused by a drop in a female hormone (estrogen). What increases the risk? You are more likely to develop this condition if: You take certain medicines. You have had your ovaries taken out. You are being treated for cancer. You have given birth or are breastfeeding. You are more than 52 years old. You smoke. What are the signs or symptoms? Pain during sex. A feeling of pressure during sex. Bleeding during sex. Burning or itching in the vagina. Burning pain when you pee (urinate). Fluid coming from your vagina. Some people do not have symptoms. How is this treated? Using a lubricant before sex. Using a moisturizer in the vagina. Using estrogen in the vagina. In some cases, you may not need treatment. Follow these instructions at home: Medicines Take all medicines only as told by your doctor. This includes medicines for dryness. Do not use herbal medicines unless your doctor says it is okay. General instructions Talk with your doctor about treatment. Do not douche. Do not use scented: Sprays. Tampons. Soaps. If sex hurts, try using lubricants right before you have sex. Contact a doctor if: You have fluid coming from the vagina that is not like normal. You have a bad smell coming from your vagina. You have new symptoms. Your symptoms do not get better when treated. Your symptoms get worse. Summary This condition happens when the lining of the vagina becomes dry and thin. It is most common in women who no longer have periods. Treatment may include using medicines for dryness. Call a doctor if your symptoms do not get better. This information is not intended to replace advice given to you by your health  care provider. Make sure you discuss any questions you have with your health care provider. Document Revised: 10/14/2019 Document Reviewed: 10/14/2019 Elsevier Patient Education  [place].

## 2021-07-06 ENCOUNTER — Ambulatory Visit: Payer: Self-pay

## 2021-07-06 ENCOUNTER — Telehealth: Payer: No Typology Code available for payment source | Admitting: Family

## 2021-07-06 DIAGNOSIS — H109 Unspecified conjunctivitis: Secondary | ICD-10-CM | POA: Diagnosis not present

## 2021-07-06 MED ORDER — POLYMYXIN B-TRIMETHOPRIM 10000-0.1 UNIT/ML-% OP SOLN: 1.0000 [drp] | Freq: Four times a day (QID) | OPHTHALMIC | 0 refills | Status: DC

## 2021-07-06 NOTE — Telephone Encounter (Signed)
Summary: right eye discomfort  ? The patient has experienced right eye discomfort since yesterday 07/05/21  ? ?The patient shares that their right eye is red and has a crust on the eye lid  ? ?Please contact further when possible   ?  ? ?Chief Complaint: Right eye pain, redness and drainage. ?Symptoms: Runny nose ?Frequency: Started yesterday. ?Pertinent Negatives: Patient denies fever ?Disposition: '[]'$ ED /'[]'$ Urgent Care (no appt availability in office) / '[]'$ Appointment(In office/virtual)/ '[]'$  [facility]/ '[]'$ Home Care/ '[]'$ Refused Recommended Disposition /'[]'$ [facility]/ '[]'$  Follow-up with PCP ?Additional Notes: Pt. Will do e-visit through My Chart.  ?Answer Assessment - Initial Assessment Questions ?1. ONSET: "When did the pain start?" (e.g., minutes, hours, days) ?    Yesterday ?2. TIMING: "Does the pain come and go, or has it been constant since it started?" (e.g., constant, intermittent, fleeting) ?    Constant ?3. SEVERITY: "How bad is the pain?"   (Scale 1-10; mild, moderate or severe) ?  - MILD (1-3): doesn't interfere with normal activities  ?  - MODERATE (4-7): interferes with normal activities or awakens from sleep  ?  - SEVERE (8-10): excruciating pain and patient unable to do normal activities ?    6 ?4. LOCATION: "Where does it hurt?"  (e.g., eyelid, eye, cheekbone) ?    Right eye ?5. CAUSE: "What do you think is causing the pain?" ?    [place] ?6. VISION: "Do you have blurred vision or changes in your vision?"  ?    No ?7. EYE DISCHARGE: "Is there any discharge (pus) from the eye(s)?"  If yes, ask: "What color is it?"  ?    Yellow ?8. FEVER: "Do you have a fever?" If Yes, ask: "What is it, how was it measured, and when did it start?"  ?    No ?9. OTHER SYMPTOMS: "Do you have any other symptoms?" (e.g., headache, nasal discharge, facial rash) ?    Runny nose ?10. PREGNANCY: "Is there any chance you are pregnant?" "When was your last menstrual period?" ?      No ? ?Protocols used: Eye Pain  and Other Symptoms-A-AH ? ?

## 2021-07-06 NOTE — Progress Notes (Signed)

## 2021-09-27 ENCOUNTER — Ambulatory Visit: Payer: No Typology Code available for payment source | Admitting: Internal Medicine

## 2021-10-25 ENCOUNTER — Encounter: Payer: No Typology Code available for payment source | Admitting: Internal Medicine

## 2021-10-31 ENCOUNTER — Encounter: Payer: Self-pay | Admitting: Internal Medicine

## 2021-11-12 ENCOUNTER — Telehealth (INDEPENDENT_AMBULATORY_CARE_PROVIDER_SITE_OTHER): Payer: No Typology Code available for payment source | Admitting: Internal Medicine

## 2021-11-12 DIAGNOSIS — F5105 Insomnia due to other mental disorder: Secondary | ICD-10-CM

## 2021-11-12 DIAGNOSIS — F419 Anxiety disorder, unspecified: Secondary | ICD-10-CM

## 2021-11-12 DIAGNOSIS — F99 Mental disorder, not otherwise specified: Secondary | ICD-10-CM

## 2021-11-12 DIAGNOSIS — F32A Depression, unspecified: Secondary | ICD-10-CM

## 2021-11-12 DIAGNOSIS — Z0289 Encounter for other administrative examinations: Secondary | ICD-10-CM

## 2021-11-12 NOTE — Patient Instructions (Signed)
Insomnia Insomnia is a sleep disorder that makes it difficult to fall asleep or stay asleep. Insomnia can cause fatigue, low energy, difficulty concentrating, mood swings, and poor performance at work or school. There are three different ways to classify insomnia: Difficulty falling asleep. Difficulty staying asleep. Waking up too early in the morning. Any type of insomnia can be long-term (chronic) or short-term (acute). Both are common. Short-term insomnia usually lasts for 3 months or less. Chronic insomnia occurs at least three times a week for longer than 3 months. What are the causes? Insomnia may be caused by another condition, situation, or substance, such as: Having certain mental health conditions, such as anxiety and depression. Using caffeine, alcohol, tobacco, or drugs. Having gastrointestinal conditions, such as gastroesophageal reflux disease (GERD). Having certain medical conditions. These include: Asthma. Alzheimer's disease. Stroke. Chronic pain. An overactive thyroid gland (hyperthyroidism). Other sleep disorders, such as restless legs syndrome and sleep apnea. Menopause. Sometimes, the cause of insomnia may not be known. What increases the risk? Risk factors for insomnia include: Gender. Females are affected more often than males. Age. Insomnia is more common as people get older. Stress and certain medical and mental health conditions. Lack of exercise. Having an irregular work schedule. This may include working night shifts and traveling between different time zones. What are the signs or symptoms? If you have insomnia, the main symptom is having trouble falling asleep or having trouble staying asleep. This may lead to other symptoms, such as: Feeling tired or having low energy. Feeling nervous about going to sleep. Not feeling rested in the morning. Having trouble concentrating. Feeling irritable, anxious, or depressed. How is this diagnosed? This condition  may be diagnosed based on: Your symptoms and medical history. Your health care provider may ask about: Your sleep habits. Any medical conditions you have. Your mental health. A physical exam. How is this treated? Treatment for insomnia depends on the cause. Treatment may focus on treating an underlying condition that is causing the insomnia. Treatment may also include: Medicines to help you sleep. Counseling or therapy. Lifestyle adjustments to help you sleep better. Follow these instructions at home: Eating and drinking  Limit or avoid alcohol, caffeinated beverages, and products that contain nicotine and tobacco, especially close to bedtime. These can disrupt your sleep. Do not eat a large meal or eat spicy foods right before bedtime. This can lead to digestive discomfort that can make it hard for you to sleep. Sleep habits  Keep a sleep diary to help you and your health care provider figure out what could be causing your insomnia. Write down: When you sleep. When you wake up during the night. How well you sleep and how rested you feel the next day. Any side effects of medicines you are taking. What you eat and drink. Make your bedroom a dark, comfortable place where it is easy to fall asleep. Put up shades or blackout curtains to block light from outside. Use a white noise machine to block noise. Keep the temperature cool. Limit screen use before bedtime. This includes: Not watching TV. Not using your smartphone, tablet, or computer. Stick to a routine that includes going to bed and waking up at the same times every day and night. This can help you fall asleep faster. Consider making a quiet activity, such as reading, part of your nighttime routine. Try to avoid taking naps during the day so that you sleep better at night. Get out of bed if you are still awake after   15 minutes of trying to sleep. Keep the lights down, but try reading or doing a quiet activity. When you feel  sleepy, go back to bed. General instructions Take over-the-counter and prescription medicines only as told by your health care provider. Exercise regularly as told by your health care provider. However, avoid exercising in the hours right before bedtime. Use relaxation techniques to manage stress. Ask your health care provider to suggest some techniques that may work well for you. These may include: Breathing exercises. Routines to release muscle tension. Visualizing peaceful scenes. Make sure that you drive carefully. Do not drive if you feel very sleepy. Keep all follow-up visits. This is important. Contact a health care provider if: You are tired throughout the day. You have trouble in your daily routine due to sleepiness. You continue to have sleep problems, or your sleep problems get worse. Get help right away if: You have thoughts about hurting yourself or someone else. Get help right away if you feel like you may hurt yourself or others, or have thoughts about taking your own life. Go to your nearest emergency room or: Call 911. Call the National Suicide Prevention Lifeline at 1-800-273-8255 or 988. This is open 24 hours a day. Text the Crisis Text Line at 741741. Summary Insomnia is a sleep disorder that makes it difficult to fall asleep or stay asleep. Insomnia can be long-term (chronic) or short-term (acute). Treatment for insomnia depends on the cause. Treatment may focus on treating an underlying condition that is causing the insomnia. Keep a sleep diary to help you and your health care provider figure out what could be causing your insomnia. This information is not intended to replace advice given to you by your health care provider. Make sure you discuss any questions you have with your health care provider. Document Revised: 03/26/2021 Document Reviewed: 03/26/2021 Elsevier Patient Education  2023 Elsevier Inc.  

## 2021-11-12 NOTE — Progress Notes (Signed)
Virtual Visit via Video Note  I connected with Mallory Meyers on 11/12/21 at  4:00 PM EDT by a video enabled telemedicine application and verified that I am speaking with the correct person using two identifiers.  Location: Patient: Home Provider: Office  Persons participating in this video call: Webb Silversmith, NP and Lowell Guitar   I discussed the limitations of evaluation and management by telemedicine and the availability of in person appointments. The patient expressed understanding and agreed to proceed.  History of Present Illness:  Patient requesting form completion.  She is trying to get a job at the [employer].  She needs documentation that she has a psychiatric disorder which includes anxiety, depression and insomnia.  She is currently taking Bupropion, Hydroxyzine and Ambien as prescribed.  She is not currently seeing a therapist or psychiatrist.  She denies SI/HI.    Past Medical History:  Diagnosis Date   Anemia    Breast cancer ([place]) 04/2018   RIGHT   Depression    Headache    Hypertension    Polycystic ovaries    Seizures (HCC)    AS A CHILD    Current Outpatient Medications  Medication Sig Dispense Refill   buPROPion (WELLBUTRIN XL) 300 MG 24 hr tablet TAKE 1 TABLET BY MOUTH EVERY DAY 90 tablet 2   cyclobenzaprine (FLEXERIL) 10 MG tablet Take 1 tablet (10 mg total) by mouth daily as needed for muscle spasms. 30 tablet 0   estradiol (ESTRACE) 0.1 MG/GM vaginal cream Apply 1 gram per vagina every night for 2 weeks, then apply three times a week 42.5 g 2   gabapentin (NEURONTIN) 300 MG capsule Take 1 capsule (300 mg total) by mouth 3 (three) times daily. 90 capsule 11   hydrOXYzine (ATARAX/VISTARIL) 10 MG tablet TAKE 1 TABLET BY MOUTH EVERY DAY AS NEEDED 90 tablet 1   letrozole (FEMARA) 2.5 MG tablet Take 2.5 mg by mouth daily.     metoprolol succinate (TOPROL-XL) 100 MG 24 hr tablet Take 1 tablet (100 mg total) by mouth daily. MUST SCHEDULE ANNUAL EXAM FOR REFILLS 90  tablet 3   Multiple Vitamins-Minerals (ONE-A-DAY WOMENS 50 PLUS PO) Take 1 tablet by mouth daily at 12 noon.     sertraline (ZOLOFT) 100 MG tablet Take 1 tablet (100 mg total) by mouth daily. 90 tablet 3   topiramate (TOPAMAX) 25 MG tablet TAKE 4 TABLETS BY MOUTH DAILY. 360 tablet 0   traMADol (ULTRAM) 50 MG tablet Take 2 tablets (100 mg total) by mouth daily as needed. 60 tablet 0   triamterene-hydrochlorothiazide (MAXZIDE-25) 37.5-25 MG tablet Take 1 tablet by mouth daily. 90 tablet 0   trimethoprim-polymyxin b (POLYTRIM) ophthalmic solution Place 1 drop into both eyes every 6 (six) hours. 10 mL 0   zolmitriptan (ZOMIG) 5 MG tablet TAKE 1 TABLET (5 MG TOTAL) BY MOUTH ONCE AS NEEDED. MAY REPEAT IN TWO HOURS AS NEEDED 6 tablet 11   zolpidem (AMBIEN) 5 MG tablet Take 1 tablet (5 mg total) by mouth at bedtime as needed for sleep. 30 tablet 0   No current facility-administered medications for this visit.    No Known Allergies  Family History  Problem Relation Age of Onset   Lung disease Mother    Hypertension Mother    Kidney disease Mother    Diabetes Mother    Colon polyps Mother        colon resection   Hypertension Maternal Grandmother    Breast cancer Neg Hx  Colon cancer Neg Hx     Social History   Socioeconomic History   Marital status: Divorced    Spouse name: Not on file   Number of children: Not on file   Years of education: Not on file   Highest education level: Not on file  Occupational History   Not on file  Tobacco Use   Smoking status: Never   Smokeless tobacco: Never  Vaping Use   Vaping Use: Never used  Substance and Sexual Activity   Alcohol use: Yes    Alcohol/week: 0.0 standard drinks of alcohol    Comment: occasional   Drug use: No   Sexual activity: Yes    Birth control/protection: None  Other Topics Concern   Not on file  Social History Narrative   Not on file   Social Determinants of Health   Financial Resource Strain: Not on file  Food  Insecurity: Not on file  Transportation Needs: Not on file  Physical Activity: Not on file  Stress: Not on file  Social Connections: Not on file  Intimate Partner Violence: Not on file     Constitutional: Denies fever, malaise, fatigue, headache or abrupt weight changes.  Respiratory: Denies difficulty breathing, shortness of breath, cough or sputum production.   Cardiovascular: Denies chest pain, chest tightness, palpitations or swelling in the hands or feet.  Neurological: Patient reports insomnia.  Denies dizziness, difficulty with memory, difficulty with speech or problems with balance and coordination.  Psych: Patient has a history of anxiety and depression.  Denies SI/HI.  No other specific complaints in a complete review of systems (except as listed in HPI above).  Observations/Objective:   Wt Readings from Last 3 Encounters:  06/21/21 194 lb (88 kg)  05/09/21 186 lb (84.4 kg)  05/02/21 186 lb (84.4 kg)    General: Appears her stated age, obese, in NAD. Pulmonary/Chest: Normal effort. No respiratory distress.  Neurological: Alert and oriented.  Psychiatric: Mood and affect normal. Behavior is normal. Judgment and thought content normal.    BMET    Component Value Date/Time   NA 138 10/24/2020 0840   NA 136 06/16/2015 1442   NA 137 08/15/2012 0949   K 4.0 10/24/2020 0840   K 3.8 08/15/2012 0949   CL 102 10/24/2020 0840   CL 104 08/15/2012 0949   CO2 30 10/24/2020 0840   CO2 26 08/15/2012 0949   GLUCOSE 95 10/24/2020 0840   GLUCOSE 92 08/15/2012 0949   BUN 21 10/24/2020 0840   BUN 15 06/16/2015 1442   BUN 16 08/15/2012 0949   CREATININE 1.03 10/24/2020 0840   CALCIUM 10.0 10/24/2020 0840   CALCIUM 9.0 08/15/2012 0949   GFRNONAA 63 10/24/2020 0840   GFRAA 73 10/24/2020 0840    Lipid Panel     Component Value Date/Time   CHOL 214 (H) 10/24/2020 0840   TRIG 142 10/24/2020 0840   HDL 50 10/24/2020 0840   CHOLHDL 4.3 10/24/2020 0840   VLDL 26.8  04/14/2019 1517   LDLCALC 137 (H) 10/24/2020 0840    CBC    Component Value Date/Time   WBC 5.3 10/24/2020 0840   RBC 5.18 (H) 10/24/2020 0840   HGB 13.7 10/24/2020 0840   HGB 13.6 06/16/2015 1442   HCT 43.3 10/24/2020 0840   HCT 40.4 06/16/2015 1442   PLT 282 10/24/2020 0840   PLT 302 06/16/2015 1442   MCV 83.6 10/24/2020 0840   MCV 82 06/16/2015 1442   MCV 82 08/15/2012 0949  MCH 26.4 (L) 10/24/2020 0840   MCHC 31.6 (L) 10/24/2020 0840   RDW 13.4 10/24/2020 0840   RDW 13.0 06/16/2015 1442   RDW 12.7 08/15/2012 0949   LYMPHSABS 2.0 12/22/2014 1052   MONOABS 0.4 12/22/2014 1052   EOSABS 0.2 12/22/2014 1052   BASOSABS 0.1 12/22/2014 1052    Hgb A1C Lab Results  Component Value Date   HGBA1C 5.6 10/24/2020        Assessment and Plan:  Encounter for Form Completion with Patient, Anxiety, Depression and Insomnia:  Meds reviewed, she will continue current meds as prescribed Letter will be provided through MyChart, she will print this off and send this to her employer Support offered  Follow Up Instructions:    I discussed the assessment and treatment plan with the patient. The patient was provided an opportunity to ask questions and all were answered. The patient agreed with the plan and demonstrated an understanding of the instructions.   The patient was advised to call back or seek an in-person evaluation if the symptoms worsen or if the condition fails to improve as anticipated.   Webb Silversmith, NP

## 2021-11-28 ENCOUNTER — Other Ambulatory Visit: Payer: Self-pay | Admitting: Internal Medicine

## 2021-11-28 DIAGNOSIS — F39 Unspecified mood [affective] disorder: Secondary | ICD-10-CM

## 2021-11-28 DIAGNOSIS — G43C1 Periodic headache syndromes in child or adult, intractable: Secondary | ICD-10-CM

## 2021-11-28 DIAGNOSIS — G43919 Migraine, unspecified, intractable, without status migrainosus: Secondary | ICD-10-CM

## 2021-11-28 NOTE — Telephone Encounter (Signed)
Medication Refill - Medication: traMADol (ULTRAM) 50 MG tablet,topiramate (TOPAMAX) 25 MG tablet,zolmitriptan (ZOMIG) 5 MG tablet buPROPion (WELLBUTRIN XL) 300 MG 24 hr tablet,sertraline (ZOLOFT) 100 MG tablet triamterene-hydrochlorothiazide (MAXZIDE-25) 37.5-25 MG tablet  Pt stated she has moved to [facility] and is requesting a courtesy refill while she can get in with a new PCP.  Pt mentioned zolmitriptan (ZOMIG) 5 MG tablet completely out and needs as soon as possible.   Has the patient contacted their pharmacy? No.  (Agent: If no, request that the patient contact the pharmacy for the refill. If patient does not wish to contact the pharmacy document the reason why and proceed with request.)   Preferred Pharmacy (with phone number or street name):  [id number] [place], [place] - [address]  [address] [place] [address]  Phone: [phone] Fax: [phone]  Hours: Not open 24 hours   Has the patient been seen for an appointment in the last year OR does the patient have an upcoming appointment? Yes.    Agent: Please be advised that RX refills may take up to 3 business days. We ask that you follow-up with your pharmacy.

## 2021-11-28 NOTE — Telephone Encounter (Signed)
Requested medication (s) are due for refill today: Yes  Requested medication (s) are on the active medication list: Yes  Last refill:  2020, 2021, 2022  Future visit scheduled: No  Notes to clinic:  Unable to refill per protocol, cannot delegate, no updated labs, [place]      Requested Prescriptions  Pending Prescriptions Disp Refills   zolpidem (AMBIEN) 5 MG tablet 30 tablet 0    Sig: Take 1 tablet (5 mg total) by mouth at bedtime as needed for sleep.     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     Not Delegated - Analgesics:  Opioid Agonists Failed - 11/28/2021  4:38 PM      Failed - This refill cannot be delegated      Failed - Urine Drug Screen completed in last 360 days      Passed - Valid encounter within last 3 months    Recent Outpatient Visits           2 weeks ago Anxiety and depression   [place], Coralie Keens, NP   5 months ago Postmenopausal atrophic vaginitis   [facility] [place], [place], NP   8 months ago Acute pain of left  shoulder   [facility] [place], Coralie Keens, NP   1 year ago Encounter for general adult medical examination with abnormal findings   [facility] [place], Coralie Keens, NP   5 years ago Pharyngitis, unspecified etiology   [facility] Glean Hess, MD               triamterene-hydrochlorothiazide (MAXZIDE-25) 37.5-25 MG tablet 90 tablet 0    Sig: Take 1 tablet by mouth daily.     Cardiovascular: Diuretic Combos Failed - 11/28/2021  4:38 PM      Failed - K in normal range and within 180 days    Potassium  Date Value Ref Range Status  10/24/2020 4.0 3.5 - 5.3 mmol/L Final  08/15/2012 3.8 3.5 - 5.1 mmol/L Final         Failed - Na in normal range and within 180 days    Sodium  Date Value Ref Range Status  10/24/2020 138 135 - 146 mmol/L Final  06/16/2015 136 134 - 144 mmol/L Final  08/15/2012  137 136 - 145 mmol/L Final         Failed - Cr in normal range and within 180 days    Creat  Date Value Ref Range Status  10/24/2020 1.03 0.50 - 1.05 mg/dL Final    Comment:    For patients >61 years of age, the reference limit for Creatinine is approximately 13% higher for people identified as African-American. .          Passed - Last BP in normal range    BP Readings from Last 1 Encounters:  06/21/21 116/75         Passed - Valid encounter within last 6 months    Recent Outpatient Visits           2 weeks ago Anxiety and depression   [facility] [place], [place], NP   5 months ago Postmenopausal atrophic vaginitis   [facility] [place], [place], NP   8 months ago Acute pain of left shoulder   [facility] [place], Coralie Keens, NP   1 year ago Encounter for general adult medical examination with abnormal findings   [facility] [place], Coralie Keens, NP   5 years ago Pharyngitis, unspecified etiology   [facility] Glean Hess, MD               sertraline (ZOLOFT)  100 MG tablet 90 tablet 3    Sig: Take 1 tablet (100 mg total) by mouth daily.     Psychiatry:  Antidepressants - SSRI - sertraline Failed - 11/28/2021  4:38 PM      Failed - AST in normal range and within 360 days    AST  Date Value Ref Range Status  10/24/2020 18 10 - 35 U/L Final   SGOT(AST)  Date Value Ref Range Status  08/15/2012 25 15 - 37 Unit/L Final         Failed - ALT in normal range and within 360 days    ALT  Date Value Ref Range Status  10/24/2020 17 6 - 29 U/L Final   SGPT (ALT)  Date Value Ref Range Status  08/15/2012 34 12 - 78 U/L Final         Passed - Completed PHQ-2 or PHQ-9 in the last 360 days      Passed - Valid encounter within last 6 months    Recent Outpatient Visits           2 weeks ago Anxiety and depression   [place], [place], NP   5 months ago Postmenopausal atrophic vaginitis   [facility] [place], [place], NP   8 months ago Acute pain of left shoulder   [facility] [place], Coralie Keens, NP   1 year ago Encounter for general adult medical examination with abnormal findings   [facility] [place], [place], NP   5 years ago Pharyngitis, unspecified etiology   [facility] Glean Hess, MD               buPROPion (WELLBUTRIN XL) 300 MG 24 hr tablet 90 tablet 2    Sig: Take 1 tablet (300 mg total) by mouth daily.     Psychiatry: Antidepressants - bupropion Failed - 11/28/2021  4:38 PM      Failed - Cr in normal range and within 360 days    Creat  Date Value Ref  Range Status  10/24/2020 1.03 0.50 - 1.05 mg/dL Final    Comment:    For patients >40 years of age, the reference limit for Creatinine is approximately 13% higher for people identified as African-American. .          Failed - AST in normal range and within 360 days    AST  Date Value Ref Range Status  10/24/2020 18 10 - 35 U/L Final   SGOT(AST)  Date Value Ref Range Status  08/15/2012  25 15 - 37 Unit/L Final         Failed - ALT in normal range and within 360 days    ALT  Date Value Ref Range Status  10/24/2020 17 6 - 29 U/L Final   SGPT (ALT)  Date Value Ref Range Status  08/15/2012 34 12 - 78 U/L Final         Passed - Completed PHQ-2 or PHQ-9 in the last 360 days      Passed - Last BP in normal range    BP Readings from Last 1 Encounters:  06/21/21 116/75         Passed - Valid encounter within last 6 months    Recent Outpatient Visits           2 weeks ago Anxiety and depression   [place], [place], NP   5 months ago Postmenopausal atrophic vaginitis   [facility] [place], [place], NP   8 months ago Acute pain of left shoulder   [facility] [place], Coralie Keens, NP   1 year ago Encounter for general adult medical examination with abnormal findings   [facility] [place], Coralie Keens, NP   5 years ago Pharyngitis, unspecified etiology   [facility] Glean Hess, MD               zolmitriptan (ZOMIG) 5 MG tablet 6 tablet 11    Sig: Take 1 tablet (5 mg total) by mouth once as needed. May repeat in two hours as needed     Neurology:  Migraine Therapy - Triptan Passed - 11/28/2021  4:38 PM      Passed - Last BP in normal range    BP Readings from Last 1 Encounters:  06/21/21 116/75         Passed - Valid encounter within last 12 months    Recent Outpatient Visits           2 weeks ago Anxiety and depression   [place], Coralie Keens, NP   5 months ago Postmenopausal atrophic vaginitis   [facility] [place], [place], NP   8 months ago Acute pain of left shoulder   [facility] [place], Coralie Keens, NP   1 year ago Encounter for general adult medical examination with abnormal findings   [facility] [place], Coralie Keens, NP   5 years ago Pharyngitis, unspecified etiology   [facility] Glean Hess, MD               topiramate (TOPAMAX) 25 MG tablet 360 tablet 0    Sig: Take 4 tablets (100 mg total) by mouth daily.     Neurology: Anticonvulsants - topiramate & zonisamide Failed - 11/28/2021  4:38 PM      Failed - Cr in normal range and within 360 days    Creat  Date Value Ref Range Status  10/24/2020 1.03 0.50 - 1.05 mg/dL Final    Comment:    For patients >67 years of age, the reference limit for Creatinine is approximately 13% higher for people identified as African-American. .          Failed - CO2 in normal range and within 360 days    CO2  Date Value Ref Range Status  10/24/2020 30 20 - 32 mmol/L Final   Co2  Date Value Ref Range Status  08/15/2012 26 21 - 32 mmol/L Final         Failed - ALT in normal range and within 360 days    ALT  Date Value Ref Range Status  10/24/2020 17 6 - 29 U/L Final   SGPT (ALT)  Date Value Ref Range Status  08/15/2012 34 12 - 78 U/L Final         Failed - AST in normal range and within 360 days    AST  Date Value Ref Range Status  10/24/2020 18 10 - 35 U/L Final   SGOT(AST)  Date Value Ref Range Status  08/15/2012 25 15 - 37 Unit/L Final         Passed - Completed PHQ-2 or PHQ-9 in the last 360 days      Passed - Valid encounter within last 12 months    Recent Outpatient Visits           2 weeks ago Anxiety and depression   [place], [place], NP   5 months ago Postmenopausal atrophic vaginitis   [facility] [place], [place], NP   8 months ago Acute pain of left shoulder   [facility] [place], Coralie Keens, NP   1 year ago Encounter for general adult medical examination with abnormal findings   [facility] [place], Coralie Keens, NP   5 years ago Pharyngitis, unspecified etiology   [facility] Glean Hess, MD

## 2021-11-29 MED ORDER — ZOLMITRIPTAN 5 MG PO TABS: 5.0000 mg | ORAL_TABLET | Freq: Once | ORAL | 0 refills | Status: AC | PRN

## 2021-11-29 MED ORDER — BUPROPION HCL ER (XL) 300 MG PO TB24: 300.0000 mg | ORAL_TABLET | Freq: Every day | ORAL | 0 refills | Status: AC

## 2021-11-29 MED ORDER — ZOLPIDEM TARTRATE 5 MG PO TABS: 5.0000 mg | ORAL_TABLET | Freq: Every evening | ORAL | 0 refills | Status: AC | PRN

## 2021-11-29 MED ORDER — SERTRALINE HCL 100 MG PO TABS: 100.0000 mg | ORAL_TABLET | Freq: Every day | ORAL | 0 refills | Status: AC

## 2021-11-29 MED ORDER — TOPIRAMATE 25 MG PO TABS: 100.0000 mg | ORAL_TABLET | Freq: Every day | ORAL | 0 refills | Status: AC

## 2021-11-29 MED ORDER — TRIAMTERENE-HCTZ 37.5-25 MG PO TABS: 1.0000 | ORAL_TABLET | Freq: Every day | ORAL | 0 refills | Status: AC
